# Patient Record
Sex: Male | Born: 1960 | Race: White | Hispanic: No | Marital: Married | Smoking: Never smoker
Health system: Southern US, Community
[De-identification: ages and names within clinical notes are randomized; demographics above are authoritative.]

## PROBLEM LIST (undated history)

## (undated) DIAGNOSIS — K7460 Unspecified cirrhosis of liver: Secondary | ICD-10-CM

## (undated) HISTORY — PX: CHOLECYSTECTOMY: SHX55

---

## 2002-12-01 ENCOUNTER — Ambulatory Visit (HOSPITAL_COMMUNITY): Admission: RE | Admit: 2002-12-01 | Discharge: 2002-12-01 | Payer: Self-pay | Admitting: Internal Medicine

## 2002-12-01 ENCOUNTER — Encounter: Payer: Self-pay | Admitting: Internal Medicine

## 2006-01-26 ENCOUNTER — Ambulatory Visit (HOSPITAL_COMMUNITY): Admission: RE | Admit: 2006-01-26 | Discharge: 2006-01-26 | Payer: Self-pay | Admitting: Family Medicine

## 2006-09-07 ENCOUNTER — Ambulatory Visit: Payer: Self-pay | Admitting: Internal Medicine

## 2006-09-07 ENCOUNTER — Inpatient Hospital Stay (HOSPITAL_COMMUNITY): Admission: AD | Admit: 2006-09-07 | Discharge: 2006-09-10 | Payer: Self-pay | Admitting: Family Medicine

## 2006-09-08 ENCOUNTER — Encounter (INDEPENDENT_AMBULATORY_CARE_PROVIDER_SITE_OTHER): Payer: Self-pay | Admitting: *Deleted

## 2006-09-15 ENCOUNTER — Ambulatory Visit: Payer: Self-pay | Admitting: Internal Medicine

## 2007-10-25 ENCOUNTER — Ambulatory Visit (HOSPITAL_COMMUNITY): Admission: RE | Admit: 2007-10-25 | Discharge: 2007-10-25 | Payer: Self-pay | Admitting: Family Medicine

## 2008-08-06 ENCOUNTER — Ambulatory Visit (HOSPITAL_COMMUNITY): Admission: RE | Admit: 2008-08-06 | Discharge: 2008-08-06 | Payer: Self-pay | Admitting: Family Medicine

## 2009-09-16 ENCOUNTER — Ambulatory Visit (HOSPITAL_COMMUNITY): Admission: RE | Admit: 2009-09-16 | Discharge: 2009-09-16 | Payer: Self-pay | Admitting: Family Medicine

## 2010-02-20 ENCOUNTER — Ambulatory Visit (HOSPITAL_COMMUNITY): Admission: RE | Admit: 2010-02-20 | Discharge: 2010-02-20 | Payer: Self-pay | Admitting: General Surgery

## 2010-12-26 NOTE — Op Note (Signed)
NAMEHAROON, SHATTO                  ACCOUNT NO.:  [id number]   MEDICAL RECORD NO.:  [id number]          PATIENT TYPE:  INP   LOCATION:  A222                          FACILITY:  [facility]   PHYSICIAN:  Lionel December, M.D.    DATE OF BIRTH:  19-Sep-1960   DATE OF PROCEDURE:  09/07/2006  DATE OF DISCHARGE:                               OPERATIVE REPORT   PROCEDURE:  Esophagogastroduodenoscopy.   INDICATION:  [name] is 50 year old Caucasian male with recurrent  epigastric pain now admitted with excruciating pain associated with  nausea and vomiting.  Prior ultrasound in April 2004 was negative but  ultrasound done this afternoon reveals cholelithiasis and borderline  thickening of the gallbladder wall.  He also has frequent heartburn.  I  have recommended cholecystectomy.  Given the chronicity of his symptoms  and his heartburn, the patient would like to have his upper GI tract  evaluated prior to surgery.  The procedure risks were reviewed with the  patient and informed consent was obtained.   MEDS FOR CONSCIOUS SEDATION:  Benzocaine spray for pharyngeal topical  anesthesia, Demerol 50 mg IV, Versed 5 mg IV.   FINDINGS:  The procedure was performed in the endoscopy suite.  The  patient's vital signs and O2 sat were monitored during the procedure and  remained stable.  The patient was placed in the left lateral position.  The Pentax videoscope was passed via oropharynx without any difficulty  into the esophagus.   Esophagus:  The mucosa of the esophagus was normal.  GE junction was  unremarkable.   Stomach:  It had a lot of bile in it.  Most of this was suctioned out.  There was no food debris.  The stomach distended very well with  insufflation.  The folds of the proximal stomach were normal.  Examination of the mucosa revealed granularity at the antrum and a  single prepyloric erosion but no ulcer crater was found.  The pyloric  channel was patent.  Angularis, fundus, and cardia were  examined by  retroflexing the scope and were normal.   Duodenum:  The bulbar mucosa was normal.  The scope was passed to the  second part of duodenum where mucosa and folds were normal.  The  endoscope was withdrawn.  The patient tolerated the procedure well.   FINAL DIAGNOSIS:  Erosive antral gastritis.   RECOMMENDATIONS:  1. H. pylori serology will be checked.  2. Surgical consultation has already been recommended and discussed      with Dr. Yetta Numbers and Dr. Lovell Sheehan.      Lionel December, M.D.  Electronically Signed     NR/MEDQ  D:  09/07/2006  T:  09/07/2006  Job:  [id number]   cc:   Kirk Ruths, M.D.  Fax: [phone]

## 2010-12-26 NOTE — Discharge Summary (Signed)
NAMEELMON, [name]   MEDICAL RECORD NO.:  [id number]          PATIENT TYPE:  INP   LOCATION:  A307                          FACILITY:  [facility]   PHYSICIAN:  Dalia Heading, M.D.  DATE OF BIRTH:  [birth year]   DATE OF ADMISSION:  09/07/2006  DATE OF DISCHARGE:  02/01/2008LH                               DISCHARGE SUMMARY   HOSPITAL COURSE SUMMARY:  Patient is a 50 year old white male who  presented to the hospital with worsening abdominal pain, nausea,  vomiting.  Dr. Regino Schultze admitted the patient.  A GI consultation was  obtained and it was felt the patient was suffering from acute  cholecystitis secondary to cholelithiasis.  He did have a mild elevation  of his liver enzyme tests.  Hepatitis screen was negative.  An EGD was  performed on September 07, 2006, which revealed erosive gastritis, but no  evidence of peptic ulcer disease.  A Surgery consultation was obtained  and the patient subsequently underwent a laparoscopic cholecystectomy  with liver biopsy on September 08, 2006.  A gangrenous gallbladder was  found.  The liver appeared to be within normal limits.  Final pathology  is still pending.   The patient tolerated the surgery well.  His diet was advanced without  difficulty.  His white blood cell count returned to normal.  His liver  enzyme tests also normalized.   The patient is being discharged home on September 10, 2006, in good and  improving condition.   DISCHARGE INSTRUCTIONS:  The patient is to follow up Dr. Franky Macho on  February 2 , 2008.  He is to drain and record his bulb suction twice a  day.   DISCHARGE MEDICATIONS:  1. Prilosec as previously prescribed.  2. Levaquin 500 mg p.o. daily x5 days.  3. Vicodin one to two tablets p.o. q.4h. p.r.n. pain.   PRINCIPAL DIAGNOSIS:  1. Cholecystitis, cholelithiasis.  2. Hepatitis, resolving.  3. Gastritis.   PRINCIPAL PROCEDURES:  1. EGD with biopsy by Dr. Karilyn Cota on September 07, 2006.  2. Laparoscopic cholecystectomy, liver biopsy on September 08, 2006.      Dalia Heading, M.D.  Electronically Signed     MAJ/MEDQ  D:  09/10/2006  T:  09/10/2006  Job:  [id number]   cc:   Dalia Heading, M.D.  Fax: [phone]   Lionel December, M.D.  [address]  [place]  [place] [address]   Kirk Ruths, M.D.  Fax: [phone]

## 2010-12-26 NOTE — Op Note (Signed)
[name], BROKER                  ACCOUNT NO.:  [id number]   MEDICAL RECORD NO.:  [id number]          PATIENT TYPE:  INP   LOCATION:  A307                          FACILITY:  [facility]   PHYSICIAN:  Dalia Heading, M.D.  DATE OF BIRTH:  [birth year]   DATE OF PROCEDURE:  09/08/2006  DATE OF DISCHARGE:                               OPERATIVE REPORT   PREOPERATIVE DIAGNOSIS:  Cholecystitis and cholelithiasis.   POSTOPERATIVE DIAGNOSIS:  Cholecystitis and cholelithiasis, gangrene of  gallbladder.   PROCEDURE:  Laparoscopic cholecystectomy, liver biopsy.   SURGEON:  Dalia Heading, M.D.   ANESTHESIA:  General endotracheal.   INDICATIONS:  The patient is a 50 year old white male who presents with  right upper quadrant abdominal pain, cholecystitis, and cholelithiasis.  He also has elevated liver enzyme tests.  The patient now comes to the  operating room for laparoscopic cholecystectomy with possible  cholangiograms and a liver biopsy.  The risks and benefits of the  procedures including bleeding, infection, hepatobiliary injury, the  possibly of an open procedure, were fully explained to the patient who  gave informed consent.   PROCEDURE NOTE:  The patient was placed in the supine position.  After  induction of general endotracheal anesthesia, the abdomen was prepped  and draped using the usual sterile technique with Betadine.  Surgical  site confirmation was performed.  A supraumbilical incision was made  down to the fascia.  A Veress needle was introduced into the abdominal  cavity and confirmation of placement was done using the saline drop  test.  The abdomen was then insufflated to 16 mmHg pressure.  An 11 mm  trocar was introduced into the abdominal cavity under direct  visualization without difficulty.  The patient was placed in reversed  Trendelenburg position.  An additional 11-mm trocar was placed in the  epigastric region and 5-mm trocars were placed in the right upper  quadrant and right flank regions.  The liver was inspected and appeared  to be within normal limits.  Two needle core biopsies were taken and  sent to pathology for further examination from the right lobe of the  liver.  Any bleeding was controlled using Bovie electrocautery.  The  gallbladder was noted be gangrenous.  Needle aspiration was performed to  decompress the gallbladder.  The dissection was begun around the  infundibulum of the gallbladder.  The cystic duct was first identified.  Its juncture to the infundibulum fully was identified.  A cholangiogram  was attempted but the catheter could not be extended through the cystic  duct due to the gangrenous changes.  Endoclips were  placed proximally  and distally on the cystic duct and the cystic duct was divided.  The  cystic artery was, likewise, ligated and divided.  The gallbladder was  freed away from the gallbladder fossa using Bovie electrocautery.  The  gallbladder was delivered through the epigastric trocar site using  EndoCatch bag.  A #10 flat Jackson-Pratt drain was placed into the  subhepatic space.  Any bleeding was controlled using Bovie  electrocautery.  Surgicel was placed in  the gallbladder fossa.  All  fluid and air was then evacuated from the abdominal cavity prior to  removal of the trocars.   All wounds were irrigated with normal saline.  All wounds were checked  with 0.5% Sensorcaine.  The supraumbilical fascia as well as epigastric  fascia were reapproximated using 0 Vicryl interrupted sutures.  All skin  incisions were closed using staples.  Betadine ointment and dry sterile  dressings were applied.  All tape and needle counts were correct at the  end of the procedure.  The patient was extubated in the operating room  and went back to the recovery room awake in stable condition.   COMPLICATIONS:  None.   SPECIMEN:  Gallbladder, liver biopsy.   BLOOD LOSS:  100 mL.   DRAINS:  Jackson-Pratt drains in the  subhepatic space.      Dalia Heading, M.D.  Electronically Signed     MAJ/MEDQ  D:  09/08/2006  T:  09/08/2006  Job:  [id number]   cc:   Kirk Ruths, M.D.  Fax: [phone]   Lionel December, M.D.  [address]  [place]  [place] [address]

## 2010-12-26 NOTE — Consult Note (Signed)
NAMELUCIFER, [name]                  ACCOUNT NO.:  [id number]   MEDICAL RECORD NO.:  [id number]          PATIENT TYPE:  INP   LOCATION:  A222                          FACILITY:  [facility]   PHYSICIAN:  Lionel December, M.D.    DATE OF BIRTH:  1960-10-09   DATE OF CONSULTATION:  09/07/2006  DATE OF DISCHARGE:                                 CONSULTATION   REASON FOR CONSULTATION:  Epigastric pain.   HISTORY OF PRESENT ILLNESS:  [name] is a 50 year old Caucasian male who  was admitted to Dr. Edison Simon service earlier today with a 4-day history  of epigastric pain. The patient states he has had epigastric pain off  and on for the past 4 years. He had an ultrasound in April 2004 which  was normal other than a fatty liver. He has been using OTC Zantac which  helped. He was in usual state of health until the past week, Friday  evening, when, within 2-3 hours of eating a meal, he developed  associated pain in his epigastric area. The pain continued, although it  decreased in intensity. He wanted to visit his son for his birthday  yesterday and decided not to seek medical advice. While he was driving  back, he had intense pain. He also had a few episodes of vomiting. He  was seen by Dr. Regino Schultze yesterday. He was begun on Zegerid, however, his  pain became intense and he went back to see him and was hospitalized.  The pain now is in the right upper quadrant. He has history of frequent  heartburn, at least 3 times a week. He has been on a PPI in the past but  not recently. He denies dysphagia, melena, or frank bleeding. He has had  a few episodes of hematochezia felt to be secondary to hemorrhoids.  Between these episodes he has maintained good appetite and he denies  weight loss. He drinks alcohol every day. He drinks 3-4 glasses of wine  or 3-4 cans of beer. He gives me the impression that his liver tests  were slightly elevated last year. He does not take any NSAIDs.   He is presently on:  1. HCTZ  12.5 mg p.o. every day.  2. Avapro 300 mg p.o. every day.  3. Protonix 40 mg IV q.24 h.  4. Dilaudid 2 mg IV q.3 or p.r.n. pain.  5. Zofran 4 mg IV q.3 h. p.r.n. nausea.   PAST MEDICAL HISTORY:  He has been hypotensive for about 5 years. He had  a vasectomy a few years ago. History of elevated transaminase for about  a year. He has received hep B vaccine through his employer. He remembers  getting all the 3 shots at usual intervals.   ALLERGIES:  To SULFA and PENICILLIN causing skin rash.   FAMILY HISTORY:  Father died of MI at age 56. Mother is in good health.  He does not have any siblings.   SOCIAL HISTORY:  He is separated. He has 2 grownup children. 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 He  smoked cigarettes for  15-20 years, but quit 10 years ago, and he drinks alcohol every day, 3-4  glasses of wine or 3-4 cans of beer daily.   PHYSICAL EXAMINATION:  A pleasant, well-developed, well-nourished,  Caucasian male who is in no acute distress. He weighs 190 pounds. He is  5 feet 10 inches tall. Pulse 84 per minute, blood pressure 135/93, temp  is 99.4, respiratory rate is 18.  CONJUNCTIVAE:  Pink.  SCLERAE:  Nonicteric.  OROPHARYNGEAL MUCOSA:  Normal. No neck masses or thyromegaly noted.  CARDIAC EXAM:  With regular rhythm, normal S1 and S2. No murmur or  gallop noted.  LUNGS:  Are clear to auscultation.  ABDOMEN:  Is symmetrical. Bowel sounds are normal on palpation. Is soft.  He has tenderness below the right costal margin which is worse with deep  inspiration. He has mild epigastric tenderness.  RECTAL EXAM:  Deferred. No clubbing or edema noted.   LABS FROM ADMISSION:  The WBC is 9.3, H&H is 14.9 and 42.5, MCV is  mildly elevated at 96.8, platelet count is 238,000, segs 83, bands 0.  Sodium 134, potassium 3.2, chloride 95, CO2 28, glucose is 144, BUN 11,  creatinine 0.95, total bilirubin is 1.6, direct is 0.3, ALP 59, AST 46,  ALT is 50. Albumin is  3.7.   An ultrasound reviewed which was just completed. It shows borderline  thickening to wall of the gallbladder, some sludge and tiny stones. Bile  duct is within normal caliber, less than 5 mm, and liver is echogenic.   ASSESSMENT:  1. [name] is a 50 year old Caucasian male who presents with severe      epigastric pain associated with nausea and vomiting who has mildly      elevated AST. His ALT is 50, which by new parameters, is normal but      higher than AST. He has cholelithiasis and borderline thickening to      wall of gallbladder. I suspect his acute symptomatology is due to      cholecystitis or cholelithiasis. Mildly elevated AST would appear      to be either due to fatty liver, mild alcoholic hepatitis, or      common bile duct stones. He has a history of frequent heartburn. He      could also have peptic ulcer disease. Recommendations: Peptic ulcer      disease needs to be ruled out prior to surgical intervention.  2. Hypokalemia secondary to diuretic therapy. Recommendations:  [name] will be added to his intravenous fluids at 20 milliequivalents      per liter.  3. He will have hepatitis B surface antigen, hepatitis B surface      antibody, and hep C antibody and liver function tests in morning.  4. Esophagogastroduodenoscopy this afternoon.   As discussed with Dr. Regino Schultze, surgical consultation has been requested  with Dr. Lovell Sheehan. Will also start the patient on Rocephin, 1 gram  intravenous every 24 hours.   I have talked with Dr. Lovell Sheehan and I feel that he should get a liver  biopsy and intraoperative cholangiogram at the time of surgery.   We appreciate the opportunity to participate in the care of this  gentleman.      Lionel December, M.D.  Electronically Signed     NR/MEDQ  D:  09/07/2006  T:  09/07/2006  Job:  [id number]

## 2010-12-26 NOTE — H&P (Signed)
[name], [name]                  ACCOUNT NO.:  [id number]   MEDICAL RECORD NO.:  [id number]          PATIENT TYPE:  INP   LOCATION:  A307                          FACILITY:  [facility]   PHYSICIAN:  Kirk Ruths, M.D.DATE OF BIRTH:  30-May-1961   DATE OF ADMISSION:  09/07/2006  DATE OF DISCHARGE:  LH                              HISTORY & PHYSICAL   CHIEF COMPLAINT:  Abdominal pain.   PRESENTING ILLNESS:  This is a 50 year old male who was seen the day  before admission with reported history of abdominal pain and bloating  and some nausea and vomiting.  The patient initially felt to have  gastroenteritis was treated with Zegerid and Phenergan, anti-emetics and  clear liquids.  The patient returned the day of admission.  The pain was  worse, localized in the mid-abdominal area.  The patient denied nausea,  vomiting, or diarrhea, although he did not have a bowel movement in 2  days.  Tenderness of his abdomen and worsening condition, he is admitted  for possible gallbladder disease, possible small bowel obstruction.   PAST MEDICAL HISTORY:  He is allergic to no medicines.  He has  hypertension, for which he takes Benicar 40/12.5.  He takes Prilosec for  chronic GERD.  The patient does admit to moderate alcohol use.  Denies  chest pain, shortness of breath.   REVIEW OF SYSTEMS:  Does have nausea and pain associated with eating.   PHYSICAL EXAMINATION:  GENERAL:  A well developed, well nourished male  who appears miserable.  VITAL SIGNS:  Temperature 99, pulse 70 and regular, blood pressure  140/100, respirations 20.  HEENT:  TMs normal.  Pupils equal, round and reactive to light and  accommodation.  Oropharynx benign.  NECK:  Supple.  No JVD, bruit, thyromegaly.  LUNGS:  Clear in all areas.  HEART:  Regular sinus rhythm without murmur, gallop or rub.  ABDOMEN:  Tender epigastric and right upper quadrant.  Positive bowel  sounds.  EXTREMITIES:  No clubbing, cyanosis or edema.  NEUROLOGIC:  Grossly intact.   ASSESSMENT:  Abdominal pain.      Kirk Ruths, M.D.  Electronically Signed     WMM/MEDQ  D:  09/09/2006  T:  09/09/2006  Job:  [id number]

## 2011-04-14 ENCOUNTER — Other Ambulatory Visit (HOSPITAL_COMMUNITY): Payer: Self-pay | Admitting: Family Medicine

## 2011-04-14 ENCOUNTER — Ambulatory Visit (HOSPITAL_COMMUNITY)
Admission: RE | Admit: 2011-04-14 | Discharge: 2011-04-14 | Disposition: A | Discharge status: Home / Self Care | Payer: BC Managed Care – PPO | Source: Ambulatory Visit | Attending: Family Medicine | Admitting: Family Medicine

## 2011-04-14 DIAGNOSIS — R05 Cough: Secondary | ICD-10-CM

## 2011-04-14 DIAGNOSIS — E119 Type 2 diabetes mellitus without complications: Secondary | ICD-10-CM

## 2011-04-14 DIAGNOSIS — R059 Cough, unspecified: Secondary | ICD-10-CM | POA: Insufficient documentation

## 2011-04-14 DIAGNOSIS — Z0000 Encounter for general adult medical examination without abnormal findings: Secondary | ICD-10-CM

## 2014-10-17 ENCOUNTER — Other Ambulatory Visit (HOSPITAL_COMMUNITY): Payer: Self-pay | Admitting: Oncology

## 2014-10-17 DIAGNOSIS — K760 Fatty (change of) liver, not elsewhere classified: Secondary | ICD-10-CM

## 2014-10-29 ENCOUNTER — Ambulatory Visit (HOSPITAL_COMMUNITY)
Admission: RE | Admit: 2014-10-29 | Discharge: 2014-10-29 | Disposition: A | Discharge status: Home / Self Care | Payer: BLUE CROSS/BLUE SHIELD | Source: Ambulatory Visit | Attending: Oncology | Admitting: Oncology

## 2014-10-29 DIAGNOSIS — R7989 Other specified abnormal findings of blood chemistry: Secondary | ICD-10-CM | POA: Diagnosis not present

## 2014-10-29 DIAGNOSIS — K760 Fatty (change of) liver, not elsewhere classified: Secondary | ICD-10-CM | POA: Insufficient documentation

## 2014-10-29 DIAGNOSIS — D696 Thrombocytopenia, unspecified: Secondary | ICD-10-CM | POA: Diagnosis not present

## 2014-10-29 MED ORDER — IOHEXOL 300 MG/ML  SOLN
100.0000 mL | Freq: Once | INTRAMUSCULAR | Status: AC | PRN
  Administered 2014-10-29: 100 mL via INTRAVENOUS

## 2015-11-14 DIAGNOSIS — E119 Type 2 diabetes mellitus without complications: Secondary | ICD-10-CM | POA: Diagnosis not present

## 2015-11-14 DIAGNOSIS — R740 Nonspecific elevation of levels of transaminase and lactic acid dehydrogenase [LDH]: Secondary | ICD-10-CM | POA: Diagnosis not present

## 2015-11-14 DIAGNOSIS — E782 Mixed hyperlipidemia: Secondary | ICD-10-CM | POA: Diagnosis not present

## 2015-11-14 DIAGNOSIS — I10 Essential (primary) hypertension: Secondary | ICD-10-CM | POA: Diagnosis not present

## 2016-02-25 DIAGNOSIS — Z79899 Other long term (current) drug therapy: Secondary | ICD-10-CM | POA: Diagnosis not present

## 2016-02-25 DIAGNOSIS — E782 Mixed hyperlipidemia: Secondary | ICD-10-CM | POA: Diagnosis not present

## 2016-02-25 DIAGNOSIS — R740 Nonspecific elevation of levels of transaminase and lactic acid dehydrogenase [LDH]: Secondary | ICD-10-CM | POA: Diagnosis not present

## 2016-02-25 DIAGNOSIS — I10 Essential (primary) hypertension: Secondary | ICD-10-CM | POA: Diagnosis not present

## 2016-02-25 DIAGNOSIS — D696 Thrombocytopenia, unspecified: Secondary | ICD-10-CM | POA: Diagnosis not present

## 2016-02-25 DIAGNOSIS — E119 Type 2 diabetes mellitus without complications: Secondary | ICD-10-CM | POA: Diagnosis not present

## 2016-03-10 DIAGNOSIS — R740 Nonspecific elevation of levels of transaminase and lactic acid dehydrogenase [LDH]: Secondary | ICD-10-CM | POA: Diagnosis not present

## 2016-03-10 DIAGNOSIS — E782 Mixed hyperlipidemia: Secondary | ICD-10-CM | POA: Diagnosis not present

## 2016-03-10 DIAGNOSIS — I10 Essential (primary) hypertension: Secondary | ICD-10-CM | POA: Diagnosis not present

## 2016-03-10 DIAGNOSIS — E119 Type 2 diabetes mellitus without complications: Secondary | ICD-10-CM | POA: Diagnosis not present

## 2016-04-09 DIAGNOSIS — D72819 Decreased white blood cell count, unspecified: Secondary | ICD-10-CM | POA: Diagnosis not present

## 2016-04-23 DIAGNOSIS — D72819 Decreased white blood cell count, unspecified: Secondary | ICD-10-CM | POA: Diagnosis not present

## 2016-05-12 DIAGNOSIS — Z6831 Body mass index (BMI) 31.0-31.9, adult: Secondary | ICD-10-CM | POA: Diagnosis not present

## 2016-05-12 DIAGNOSIS — Z1389 Encounter for screening for other disorder: Secondary | ICD-10-CM | POA: Diagnosis not present

## 2016-05-12 DIAGNOSIS — Z23 Encounter for immunization: Secondary | ICD-10-CM | POA: Diagnosis not present

## 2016-05-12 DIAGNOSIS — E6609 Other obesity due to excess calories: Secondary | ICD-10-CM | POA: Diagnosis not present

## 2016-05-12 DIAGNOSIS — L02425 Furuncle of right lower limb: Secondary | ICD-10-CM | POA: Diagnosis not present

## 2016-05-12 DIAGNOSIS — B9562 Methicillin resistant Staphylococcus aureus infection as the cause of diseases classified elsewhere: Secondary | ICD-10-CM | POA: Diagnosis not present

## 2016-07-09 DIAGNOSIS — E782 Mixed hyperlipidemia: Secondary | ICD-10-CM | POA: Diagnosis not present

## 2016-07-09 DIAGNOSIS — R740 Nonspecific elevation of levels of transaminase and lactic acid dehydrogenase [LDH]: Secondary | ICD-10-CM | POA: Diagnosis not present

## 2016-07-09 DIAGNOSIS — E119 Type 2 diabetes mellitus without complications: Secondary | ICD-10-CM | POA: Diagnosis not present

## 2016-07-09 DIAGNOSIS — I10 Essential (primary) hypertension: Secondary | ICD-10-CM | POA: Diagnosis not present

## 2016-07-28 DIAGNOSIS — E559 Vitamin D deficiency, unspecified: Secondary | ICD-10-CM | POA: Diagnosis not present

## 2016-07-28 DIAGNOSIS — Z1389 Encounter for screening for other disorder: Secondary | ICD-10-CM | POA: Diagnosis not present

## 2016-07-28 DIAGNOSIS — D696 Thrombocytopenia, unspecified: Secondary | ICD-10-CM | POA: Diagnosis not present

## 2016-07-28 DIAGNOSIS — E782 Mixed hyperlipidemia: Secondary | ICD-10-CM | POA: Diagnosis not present

## 2016-07-28 DIAGNOSIS — E119 Type 2 diabetes mellitus without complications: Secondary | ICD-10-CM | POA: Diagnosis not present

## 2016-07-28 DIAGNOSIS — Z79899 Other long term (current) drug therapy: Secondary | ICD-10-CM | POA: Diagnosis not present

## 2016-08-14 DIAGNOSIS — R740 Nonspecific elevation of levels of transaminase and lactic acid dehydrogenase [LDH]: Secondary | ICD-10-CM | POA: Diagnosis not present

## 2016-08-14 DIAGNOSIS — Z0001 Encounter for general adult medical examination with abnormal findings: Secondary | ICD-10-CM | POA: Diagnosis not present

## 2016-08-14 DIAGNOSIS — Z1389 Encounter for screening for other disorder: Secondary | ICD-10-CM | POA: Diagnosis not present

## 2016-08-14 DIAGNOSIS — E1129 Type 2 diabetes mellitus with other diabetic kidney complication: Secondary | ICD-10-CM | POA: Diagnosis not present

## 2016-08-14 DIAGNOSIS — Z6831 Body mass index (BMI) 31.0-31.9, adult: Secondary | ICD-10-CM | POA: Diagnosis not present

## 2016-08-25 DIAGNOSIS — E782 Mixed hyperlipidemia: Secondary | ICD-10-CM | POA: Diagnosis not present

## 2016-08-25 DIAGNOSIS — E119 Type 2 diabetes mellitus without complications: Secondary | ICD-10-CM | POA: Diagnosis not present

## 2016-08-25 DIAGNOSIS — I10 Essential (primary) hypertension: Secondary | ICD-10-CM | POA: Diagnosis not present

## 2016-08-25 DIAGNOSIS — R740 Nonspecific elevation of levels of transaminase and lactic acid dehydrogenase [LDH]: Secondary | ICD-10-CM | POA: Diagnosis not present

## 2017-01-05 DIAGNOSIS — R740 Nonspecific elevation of levels of transaminase and lactic acid dehydrogenase [LDH]: Secondary | ICD-10-CM | POA: Diagnosis not present

## 2017-01-05 DIAGNOSIS — D72819 Decreased white blood cell count, unspecified: Secondary | ICD-10-CM | POA: Diagnosis not present

## 2017-01-05 DIAGNOSIS — Z139 Encounter for screening, unspecified: Secondary | ICD-10-CM | POA: Diagnosis not present

## 2017-01-05 DIAGNOSIS — I10 Essential (primary) hypertension: Secondary | ICD-10-CM | POA: Diagnosis not present

## 2017-01-05 DIAGNOSIS — Z719 Counseling, unspecified: Secondary | ICD-10-CM | POA: Diagnosis not present

## 2017-01-05 DIAGNOSIS — Z008 Encounter for other general examination: Secondary | ICD-10-CM | POA: Diagnosis not present

## 2017-01-05 DIAGNOSIS — E119 Type 2 diabetes mellitus without complications: Secondary | ICD-10-CM | POA: Diagnosis not present

## 2017-01-12 DIAGNOSIS — R740 Nonspecific elevation of levels of transaminase and lactic acid dehydrogenase [LDH]: Secondary | ICD-10-CM | POA: Diagnosis not present

## 2017-01-12 DIAGNOSIS — D696 Thrombocytopenia, unspecified: Secondary | ICD-10-CM | POA: Diagnosis not present

## 2017-01-12 DIAGNOSIS — E119 Type 2 diabetes mellitus without complications: Secondary | ICD-10-CM | POA: Diagnosis not present

## 2017-04-20 DIAGNOSIS — Z008 Encounter for other general examination: Secondary | ICD-10-CM | POA: Diagnosis not present

## 2017-04-20 DIAGNOSIS — I10 Essential (primary) hypertension: Secondary | ICD-10-CM | POA: Diagnosis not present

## 2017-04-20 DIAGNOSIS — E782 Mixed hyperlipidemia: Secondary | ICD-10-CM | POA: Diagnosis not present

## 2017-04-20 DIAGNOSIS — E119 Type 2 diabetes mellitus without complications: Secondary | ICD-10-CM | POA: Diagnosis not present

## 2017-04-20 DIAGNOSIS — D696 Thrombocytopenia, unspecified: Secondary | ICD-10-CM | POA: Diagnosis not present

## 2017-04-20 DIAGNOSIS — Z719 Counseling, unspecified: Secondary | ICD-10-CM | POA: Diagnosis not present

## 2017-04-20 DIAGNOSIS — R740 Nonspecific elevation of levels of transaminase and lactic acid dehydrogenase [LDH]: Secondary | ICD-10-CM | POA: Diagnosis not present

## 2017-05-04 DIAGNOSIS — E119 Type 2 diabetes mellitus without complications: Secondary | ICD-10-CM | POA: Diagnosis not present

## 2017-05-04 DIAGNOSIS — R740 Nonspecific elevation of levels of transaminase and lactic acid dehydrogenase [LDH]: Secondary | ICD-10-CM | POA: Diagnosis not present

## 2017-05-04 DIAGNOSIS — E876 Hypokalemia: Secondary | ICD-10-CM | POA: Diagnosis not present

## 2017-05-04 DIAGNOSIS — I10 Essential (primary) hypertension: Secondary | ICD-10-CM | POA: Diagnosis not present

## 2017-05-11 DIAGNOSIS — R740 Nonspecific elevation of levels of transaminase and lactic acid dehydrogenase [LDH]: Secondary | ICD-10-CM | POA: Diagnosis not present

## 2017-05-11 DIAGNOSIS — E119 Type 2 diabetes mellitus without complications: Secondary | ICD-10-CM | POA: Diagnosis not present

## 2017-05-11 DIAGNOSIS — Z008 Encounter for other general examination: Secondary | ICD-10-CM | POA: Diagnosis not present

## 2017-05-11 DIAGNOSIS — I10 Essential (primary) hypertension: Secondary | ICD-10-CM | POA: Diagnosis not present

## 2017-05-11 DIAGNOSIS — Z719 Counseling, unspecified: Secondary | ICD-10-CM | POA: Diagnosis not present

## 2017-08-31 DIAGNOSIS — I10 Essential (primary) hypertension: Secondary | ICD-10-CM | POA: Diagnosis not present

## 2017-08-31 DIAGNOSIS — Z008 Encounter for other general examination: Secondary | ICD-10-CM | POA: Diagnosis not present

## 2017-08-31 DIAGNOSIS — R740 Nonspecific elevation of levels of transaminase and lactic acid dehydrogenase [LDH]: Secondary | ICD-10-CM | POA: Diagnosis not present

## 2017-08-31 DIAGNOSIS — D696 Thrombocytopenia, unspecified: Secondary | ICD-10-CM | POA: Diagnosis not present

## 2017-08-31 DIAGNOSIS — E119 Type 2 diabetes mellitus without complications: Secondary | ICD-10-CM | POA: Diagnosis not present

## 2017-08-31 DIAGNOSIS — Z79899 Other long term (current) drug therapy: Secondary | ICD-10-CM | POA: Diagnosis not present

## 2017-08-31 DIAGNOSIS — Z719 Counseling, unspecified: Secondary | ICD-10-CM | POA: Diagnosis not present

## 2017-08-31 DIAGNOSIS — E782 Mixed hyperlipidemia: Secondary | ICD-10-CM | POA: Diagnosis not present

## 2017-09-09 DIAGNOSIS — E119 Type 2 diabetes mellitus without complications: Secondary | ICD-10-CM | POA: Diagnosis not present

## 2017-09-09 DIAGNOSIS — R740 Nonspecific elevation of levels of transaminase and lactic acid dehydrogenase [LDH]: Secondary | ICD-10-CM | POA: Diagnosis not present

## 2017-09-09 DIAGNOSIS — E782 Mixed hyperlipidemia: Secondary | ICD-10-CM | POA: Diagnosis not present

## 2017-09-09 DIAGNOSIS — I10 Essential (primary) hypertension: Secondary | ICD-10-CM | POA: Diagnosis not present

## 2017-09-10 DIAGNOSIS — Z1389 Encounter for screening for other disorder: Secondary | ICD-10-CM | POA: Diagnosis not present

## 2017-09-10 DIAGNOSIS — E782 Mixed hyperlipidemia: Secondary | ICD-10-CM | POA: Diagnosis not present

## 2017-09-10 DIAGNOSIS — I10 Essential (primary) hypertension: Secondary | ICD-10-CM | POA: Diagnosis not present

## 2017-09-10 DIAGNOSIS — R011 Cardiac murmur, unspecified: Secondary | ICD-10-CM | POA: Diagnosis not present

## 2017-09-10 DIAGNOSIS — K769 Liver disease, unspecified: Secondary | ICD-10-CM | POA: Diagnosis not present

## 2017-09-10 DIAGNOSIS — Z6832 Body mass index (BMI) 32.0-32.9, adult: Secondary | ICD-10-CM | POA: Diagnosis not present

## 2017-09-10 DIAGNOSIS — E1165 Type 2 diabetes mellitus with hyperglycemia: Secondary | ICD-10-CM | POA: Diagnosis not present

## 2017-09-10 DIAGNOSIS — E1129 Type 2 diabetes mellitus with other diabetic kidney complication: Secondary | ICD-10-CM | POA: Diagnosis not present

## 2017-09-28 DIAGNOSIS — J0100 Acute maxillary sinusitis, unspecified: Secondary | ICD-10-CM | POA: Diagnosis not present

## 2017-10-18 DIAGNOSIS — D6949 Other primary thrombocytopenia: Secondary | ICD-10-CM | POA: Diagnosis not present

## 2017-10-18 DIAGNOSIS — Z6831 Body mass index (BMI) 31.0-31.9, adult: Secondary | ICD-10-CM | POA: Diagnosis not present

## 2017-10-18 DIAGNOSIS — Z0001 Encounter for general adult medical examination with abnormal findings: Secondary | ICD-10-CM | POA: Diagnosis not present

## 2017-10-18 DIAGNOSIS — Z1389 Encounter for screening for other disorder: Secondary | ICD-10-CM | POA: Diagnosis not present

## 2017-10-18 DIAGNOSIS — R000 Tachycardia, unspecified: Secondary | ICD-10-CM | POA: Diagnosis not present

## 2017-10-18 DIAGNOSIS — R319 Hematuria, unspecified: Secondary | ICD-10-CM | POA: Diagnosis not present

## 2017-10-18 DIAGNOSIS — R740 Nonspecific elevation of levels of transaminase and lactic acid dehydrogenase [LDH]: Secondary | ICD-10-CM | POA: Diagnosis not present

## 2017-10-18 DIAGNOSIS — R011 Cardiac murmur, unspecified: Secondary | ICD-10-CM | POA: Diagnosis not present

## 2017-10-18 DIAGNOSIS — E1129 Type 2 diabetes mellitus with other diabetic kidney complication: Secondary | ICD-10-CM | POA: Diagnosis not present

## 2017-10-18 DIAGNOSIS — D696 Thrombocytopenia, unspecified: Secondary | ICD-10-CM | POA: Diagnosis not present

## 2017-12-14 DIAGNOSIS — R740 Nonspecific elevation of levels of transaminase and lactic acid dehydrogenase [LDH]: Secondary | ICD-10-CM | POA: Diagnosis not present

## 2017-12-14 DIAGNOSIS — Z139 Encounter for screening, unspecified: Secondary | ICD-10-CM | POA: Diagnosis not present

## 2017-12-14 DIAGNOSIS — E1129 Type 2 diabetes mellitus with other diabetic kidney complication: Secondary | ICD-10-CM | POA: Diagnosis not present

## 2017-12-14 DIAGNOSIS — L309 Dermatitis, unspecified: Secondary | ICD-10-CM | POA: Diagnosis not present

## 2017-12-14 DIAGNOSIS — I10 Essential (primary) hypertension: Secondary | ICD-10-CM | POA: Diagnosis not present

## 2017-12-14 DIAGNOSIS — E119 Type 2 diabetes mellitus without complications: Secondary | ICD-10-CM | POA: Diagnosis not present

## 2017-12-14 DIAGNOSIS — Z719 Counseling, unspecified: Secondary | ICD-10-CM | POA: Diagnosis not present

## 2017-12-14 DIAGNOSIS — Z008 Encounter for other general examination: Secondary | ICD-10-CM | POA: Diagnosis not present

## 2017-12-14 DIAGNOSIS — E782 Mixed hyperlipidemia: Secondary | ICD-10-CM | POA: Diagnosis not present

## 2017-12-16 DIAGNOSIS — E119 Type 2 diabetes mellitus without complications: Secondary | ICD-10-CM | POA: Diagnosis not present

## 2017-12-16 DIAGNOSIS — E782 Mixed hyperlipidemia: Secondary | ICD-10-CM | POA: Diagnosis not present

## 2017-12-16 DIAGNOSIS — D696 Thrombocytopenia, unspecified: Secondary | ICD-10-CM | POA: Diagnosis not present

## 2017-12-16 DIAGNOSIS — R740 Nonspecific elevation of levels of transaminase and lactic acid dehydrogenase [LDH]: Secondary | ICD-10-CM | POA: Diagnosis not present

## 2017-12-17 DIAGNOSIS — D696 Thrombocytopenia, unspecified: Secondary | ICD-10-CM | POA: Diagnosis not present

## 2017-12-17 DIAGNOSIS — Z1389 Encounter for screening for other disorder: Secondary | ICD-10-CM | POA: Diagnosis not present

## 2017-12-17 DIAGNOSIS — E7849 Other hyperlipidemia: Secondary | ICD-10-CM | POA: Diagnosis not present

## 2017-12-17 DIAGNOSIS — K769 Liver disease, unspecified: Secondary | ICD-10-CM | POA: Diagnosis not present

## 2017-12-17 DIAGNOSIS — D6942 Congenital and hereditary thrombocytopenia purpura: Secondary | ICD-10-CM | POA: Diagnosis not present

## 2017-12-17 DIAGNOSIS — E6609 Other obesity due to excess calories: Secondary | ICD-10-CM | POA: Diagnosis not present

## 2017-12-17 DIAGNOSIS — Z6831 Body mass index (BMI) 31.0-31.9, adult: Secondary | ICD-10-CM | POA: Diagnosis not present

## 2017-12-17 DIAGNOSIS — E559 Vitamin D deficiency, unspecified: Secondary | ICD-10-CM | POA: Diagnosis not present

## 2017-12-17 DIAGNOSIS — E119 Type 2 diabetes mellitus without complications: Secondary | ICD-10-CM | POA: Diagnosis not present

## 2018-02-24 DIAGNOSIS — Z008 Encounter for other general examination: Secondary | ICD-10-CM | POA: Diagnosis not present

## 2018-02-24 DIAGNOSIS — R740 Nonspecific elevation of levels of transaminase and lactic acid dehydrogenase [LDH]: Secondary | ICD-10-CM | POA: Diagnosis not present

## 2018-02-24 DIAGNOSIS — E119 Type 2 diabetes mellitus without complications: Secondary | ICD-10-CM | POA: Diagnosis not present

## 2018-02-24 DIAGNOSIS — I10 Essential (primary) hypertension: Secondary | ICD-10-CM | POA: Diagnosis not present

## 2018-02-24 DIAGNOSIS — Z719 Counseling, unspecified: Secondary | ICD-10-CM | POA: Diagnosis not present

## 2018-06-23 DIAGNOSIS — I10 Essential (primary) hypertension: Secondary | ICD-10-CM | POA: Diagnosis not present

## 2018-06-23 DIAGNOSIS — Z719 Counseling, unspecified: Secondary | ICD-10-CM | POA: Diagnosis not present

## 2018-06-23 DIAGNOSIS — R740 Nonspecific elevation of levels of transaminase and lactic acid dehydrogenase [LDH]: Secondary | ICD-10-CM | POA: Diagnosis not present

## 2018-06-23 DIAGNOSIS — E119 Type 2 diabetes mellitus without complications: Secondary | ICD-10-CM | POA: Diagnosis not present

## 2018-08-30 DIAGNOSIS — I10 Essential (primary) hypertension: Secondary | ICD-10-CM | POA: Diagnosis not present

## 2018-08-30 DIAGNOSIS — Z719 Counseling, unspecified: Secondary | ICD-10-CM | POA: Diagnosis not present

## 2018-08-30 DIAGNOSIS — Z008 Encounter for other general examination: Secondary | ICD-10-CM | POA: Diagnosis not present

## 2018-08-30 DIAGNOSIS — E119 Type 2 diabetes mellitus without complications: Secondary | ICD-10-CM | POA: Diagnosis not present

## 2018-10-20 DIAGNOSIS — E119 Type 2 diabetes mellitus without complications: Secondary | ICD-10-CM | POA: Diagnosis not present

## 2018-10-20 DIAGNOSIS — E7849 Other hyperlipidemia: Secondary | ICD-10-CM | POA: Diagnosis not present

## 2018-10-20 DIAGNOSIS — I10 Essential (primary) hypertension: Secondary | ICD-10-CM | POA: Diagnosis not present

## 2018-10-20 DIAGNOSIS — Z0001 Encounter for general adult medical examination with abnormal findings: Secondary | ICD-10-CM | POA: Diagnosis not present

## 2018-10-20 DIAGNOSIS — Z6829 Body mass index (BMI) 29.0-29.9, adult: Secondary | ICD-10-CM | POA: Diagnosis not present

## 2018-10-20 DIAGNOSIS — E663 Overweight: Secondary | ICD-10-CM | POA: Diagnosis not present

## 2018-10-20 DIAGNOSIS — Z1389 Encounter for screening for other disorder: Secondary | ICD-10-CM | POA: Diagnosis not present

## 2018-10-21 DIAGNOSIS — Z0001 Encounter for general adult medical examination with abnormal findings: Secondary | ICD-10-CM | POA: Diagnosis not present

## 2018-10-21 DIAGNOSIS — E119 Type 2 diabetes mellitus without complications: Secondary | ICD-10-CM | POA: Diagnosis not present

## 2018-10-21 DIAGNOSIS — E559 Vitamin D deficiency, unspecified: Secondary | ICD-10-CM | POA: Diagnosis not present

## 2018-10-21 DIAGNOSIS — Z1389 Encounter for screening for other disorder: Secondary | ICD-10-CM | POA: Diagnosis not present

## 2018-10-21 DIAGNOSIS — E1129 Type 2 diabetes mellitus with other diabetic kidney complication: Secondary | ICD-10-CM | POA: Diagnosis not present

## 2019-03-09 DIAGNOSIS — Z719 Counseling, unspecified: Secondary | ICD-10-CM | POA: Diagnosis not present

## 2019-05-16 DIAGNOSIS — D696 Thrombocytopenia, unspecified: Secondary | ICD-10-CM | POA: Diagnosis not present

## 2019-05-16 DIAGNOSIS — Z139 Encounter for screening, unspecified: Secondary | ICD-10-CM | POA: Diagnosis not present

## 2019-05-16 DIAGNOSIS — E782 Mixed hyperlipidemia: Secondary | ICD-10-CM | POA: Diagnosis not present

## 2019-05-16 DIAGNOSIS — I10 Essential (primary) hypertension: Secondary | ICD-10-CM | POA: Diagnosis not present

## 2019-05-16 DIAGNOSIS — Z79899 Other long term (current) drug therapy: Secondary | ICD-10-CM | POA: Diagnosis not present

## 2019-05-16 DIAGNOSIS — E119 Type 2 diabetes mellitus without complications: Secondary | ICD-10-CM | POA: Diagnosis not present

## 2019-05-16 DIAGNOSIS — E559 Vitamin D deficiency, unspecified: Secondary | ICD-10-CM | POA: Diagnosis not present

## 2019-05-25 DIAGNOSIS — Z23 Encounter for immunization: Secondary | ICD-10-CM | POA: Diagnosis not present

## 2019-06-01 DIAGNOSIS — E663 Overweight: Secondary | ICD-10-CM | POA: Diagnosis not present

## 2019-06-01 DIAGNOSIS — I10 Essential (primary) hypertension: Secondary | ICD-10-CM | POA: Diagnosis not present

## 2019-06-01 DIAGNOSIS — E119 Type 2 diabetes mellitus without complications: Secondary | ICD-10-CM | POA: Diagnosis not present

## 2019-06-01 DIAGNOSIS — Z6826 Body mass index (BMI) 26.0-26.9, adult: Secondary | ICD-10-CM | POA: Diagnosis not present

## 2019-06-01 DIAGNOSIS — E7849 Other hyperlipidemia: Secondary | ICD-10-CM | POA: Diagnosis not present

## 2019-07-20 DIAGNOSIS — D649 Anemia, unspecified: Secondary | ICD-10-CM | POA: Diagnosis not present

## 2019-07-25 DIAGNOSIS — D649 Anemia, unspecified: Secondary | ICD-10-CM | POA: Diagnosis not present

## 2019-09-20 DIAGNOSIS — R945 Abnormal results of liver function studies: Secondary | ICD-10-CM | POA: Diagnosis not present

## 2019-09-20 DIAGNOSIS — E7849 Other hyperlipidemia: Secondary | ICD-10-CM | POA: Diagnosis not present

## 2019-09-20 DIAGNOSIS — Z6827 Body mass index (BMI) 27.0-27.9, adult: Secondary | ICD-10-CM | POA: Diagnosis not present

## 2019-09-20 DIAGNOSIS — D649 Anemia, unspecified: Secondary | ICD-10-CM | POA: Diagnosis not present

## 2019-09-21 ENCOUNTER — Other Ambulatory Visit: Payer: Self-pay | Admitting: Family Medicine

## 2019-09-21 ENCOUNTER — Other Ambulatory Visit (HOSPITAL_COMMUNITY): Payer: Self-pay | Admitting: Family Medicine

## 2019-09-21 DIAGNOSIS — R7989 Other specified abnormal findings of blood chemistry: Secondary | ICD-10-CM

## 2019-09-21 DIAGNOSIS — R945 Abnormal results of liver function studies: Secondary | ICD-10-CM

## 2019-09-21 DIAGNOSIS — D649 Anemia, unspecified: Secondary | ICD-10-CM

## 2019-09-29 ENCOUNTER — Other Ambulatory Visit: Payer: Self-pay

## 2019-09-29 ENCOUNTER — Ambulatory Visit (HOSPITAL_COMMUNITY)
Admission: RE | Admit: 2019-09-29 | Discharge: 2019-09-29 | Disposition: A | Discharge status: Home / Self Care | Payer: BC Managed Care – PPO | Source: Ambulatory Visit | Attending: Family Medicine | Admitting: Family Medicine

## 2019-09-29 DIAGNOSIS — D649 Anemia, unspecified: Secondary | ICD-10-CM | POA: Insufficient documentation

## 2019-09-29 DIAGNOSIS — R945 Abnormal results of liver function studies: Secondary | ICD-10-CM | POA: Insufficient documentation

## 2019-09-29 DIAGNOSIS — R188 Other ascites: Secondary | ICD-10-CM | POA: Diagnosis not present

## 2019-09-29 DIAGNOSIS — R7989 Other specified abnormal findings of blood chemistry: Secondary | ICD-10-CM | POA: Diagnosis not present

## 2019-10-09 ENCOUNTER — Other Ambulatory Visit: Payer: Self-pay

## 2019-10-09 ENCOUNTER — Encounter (HOSPITAL_COMMUNITY): Payer: Self-pay | Admitting: Hematology

## 2019-10-09 ENCOUNTER — Inpatient Hospital Stay (HOSPITAL_COMMUNITY): Payer: BC Managed Care – PPO | Attending: Hematology | Admitting: Hematology

## 2019-10-09 ENCOUNTER — Inpatient Hospital Stay (HOSPITAL_COMMUNITY): Payer: BC Managed Care – PPO

## 2019-10-09 VITALS — BP 94/61 | HR 110 | Temp 97.1°F | Resp 18 | Ht 69.0 in | Wt 183.0 lb

## 2019-10-09 DIAGNOSIS — R319 Hematuria, unspecified: Secondary | ICD-10-CM | POA: Insufficient documentation

## 2019-10-09 DIAGNOSIS — D539 Nutritional anemia, unspecified: Secondary | ICD-10-CM | POA: Diagnosis not present

## 2019-10-09 DIAGNOSIS — R634 Abnormal weight loss: Secondary | ICD-10-CM | POA: Diagnosis not present

## 2019-10-09 DIAGNOSIS — R17 Unspecified jaundice: Secondary | ICD-10-CM | POA: Diagnosis not present

## 2019-10-09 DIAGNOSIS — R188 Other ascites: Secondary | ICD-10-CM | POA: Diagnosis not present

## 2019-10-09 DIAGNOSIS — J90 Pleural effusion, not elsewhere classified: Secondary | ICD-10-CM | POA: Diagnosis not present

## 2019-10-09 DIAGNOSIS — R7989 Other specified abnormal findings of blood chemistry: Secondary | ICD-10-CM | POA: Diagnosis not present

## 2019-10-09 DIAGNOSIS — Z148 Genetic carrier of other disease: Secondary | ICD-10-CM | POA: Diagnosis not present

## 2019-10-09 DIAGNOSIS — M7989 Other specified soft tissue disorders: Secondary | ICD-10-CM | POA: Diagnosis not present

## 2019-10-09 DIAGNOSIS — K7460 Unspecified cirrhosis of liver: Secondary | ICD-10-CM | POA: Diagnosis not present

## 2019-10-09 DIAGNOSIS — D649 Anemia, unspecified: Secondary | ICD-10-CM

## 2019-10-09 DIAGNOSIS — Z79899 Other long term (current) drug therapy: Secondary | ICD-10-CM | POA: Diagnosis not present

## 2019-10-09 DIAGNOSIS — Z87891 Personal history of nicotine dependence: Secondary | ICD-10-CM | POA: Insufficient documentation

## 2019-10-09 DIAGNOSIS — R5383 Other fatigue: Secondary | ICD-10-CM | POA: Diagnosis not present

## 2019-10-09 LAB — COMPREHENSIVE METABOLIC PANEL
ALT: 31 U/L (ref 0–44)
AST: 86 U/L — ABNORMAL HIGH (ref 15–41)
Albumin: 1.6 g/dL — ABNORMAL LOW (ref 3.5–5.0)
Alkaline Phosphatase: 250 U/L — ABNORMAL HIGH (ref 38–126)
Anion gap: 8 (ref 5–15)
BUN: 21 mg/dL — ABNORMAL HIGH (ref 6–20)
CO2: 21 mmol/L — ABNORMAL LOW (ref 22–32)
Calcium: 8.5 mg/dL — ABNORMAL LOW (ref 8.9–10.3)
Chloride: 100 mmol/L (ref 98–111)
Creatinine, Ser: 1.4 mg/dL — ABNORMAL HIGH (ref 0.61–1.24)
GFR calc Af Amer: >60 mL/min (ref >60)
GFR calc non Af Amer: 54 mL/min — ABNORMAL LOW (ref >60)
Glucose, Bld: 163 mg/dL — ABNORMAL HIGH (ref 70–99)
Potassium: 5 mmol/L (ref 3.5–5.1)
Sodium: 129 mmol/L — ABNORMAL LOW (ref 135–145)
Total Bilirubin: 6.7 mg/dL — ABNORMAL HIGH (ref 0.3–1.2)
Total Protein: 7.1 g/dL (ref 6.5–8.1)

## 2019-10-09 LAB — URINALYSIS, ROUTINE W REFLEX MICROSCOPIC
Bilirubin Urine: NEGATIVE
Glucose, UA: NEGATIVE mg/dL
Ketones, ur: NEGATIVE mg/dL
Nitrite: NEGATIVE
Protein, ur: 100 mg/dL — AB
RBC / HPF: >50 RBC/hpf — ABNORMAL HIGH (ref 0–5)
Specific Gravity, Urine: 1.014 (ref 1.005–1.030)
WBC, UA: >50 WBC/hpf — ABNORMAL HIGH (ref 0–5)
pH: 6 (ref 5.0–8.0)

## 2019-10-09 LAB — CBC WITH DIFFERENTIAL/PLATELET
Abs Immature Granulocytes: 0.11 10*3/uL — ABNORMAL HIGH (ref 0.00–0.07)
Basophils Absolute: 0.1 10*3/uL (ref 0.0–0.1)
Basophils Relative: 1 %
Eosinophils Absolute: 0.2 10*3/uL (ref 0.0–0.5)
Eosinophils Relative: 2 %
HCT: 23.3 % — ABNORMAL LOW (ref 39.0–52.0)
Hemoglobin: 7.8 g/dL — ABNORMAL LOW (ref 13.0–17.0)
Immature Granulocytes: 1 %
Lymphocytes Relative: 12 %
Lymphs Abs: 1.1 10*3/uL (ref 0.7–4.0)
MCH: 37 pg — ABNORMAL HIGH (ref 26.0–34.0)
MCHC: 33.5 g/dL (ref 30.0–36.0)
MCV: 110.4 fL — ABNORMAL HIGH (ref 80.0–100.0)
Monocytes Absolute: 1.1 10*3/uL — ABNORMAL HIGH (ref 0.1–1.0)
Monocytes Relative: 11 %
Neutro Abs: 7.2 10*3/uL (ref 1.7–7.7)
Neutrophils Relative %: 73 %
Platelets: 161 10*3/uL (ref 150–400)
RBC: 2.11 MIL/uL — ABNORMAL LOW (ref 4.22–5.81)
RDW: 16.3 % — ABNORMAL HIGH (ref 11.5–15.5)
WBC: 9.8 10*3/uL (ref 4.0–10.5)
nRBC: 0 % (ref 0.0–0.2)

## 2019-10-09 LAB — LACTATE DEHYDROGENASE: LDH: 282 U/L — ABNORMAL HIGH (ref 98–192)

## 2019-10-09 LAB — APTT: aPTT: 43 seconds — ABNORMAL HIGH (ref 24–36)

## 2019-10-09 LAB — FIBRINOGEN: Fibrinogen: 188 mg/dL — ABNORMAL LOW (ref 210–475)

## 2019-10-09 LAB — PROTIME-INR
INR: 2 — ABNORMAL HIGH (ref 0.8–1.2)
Prothrombin Time: 23 seconds — ABNORMAL HIGH (ref 11.4–15.2)

## 2019-10-09 LAB — RETICULOCYTES
Immature Retic Fract: 11.1 % (ref 2.3–15.9)
RBC.: 2.1 MIL/uL — ABNORMAL LOW (ref 4.22–5.81)
Retic Count, Absolute: 93.7 10*3/uL (ref 19.0–186.0)
Retic Ct Pct: 4.5 % — ABNORMAL HIGH (ref 0.4–3.1)

## 2019-10-09 LAB — BILIRUBIN, DIRECT: Bilirubin, Direct: 3 mg/dL — ABNORMAL HIGH (ref 0.0–0.2)

## 2019-10-09 MED ORDER — CIPROFLOXACIN HCL 500 MG PO TABS: 500.0000 mg | ORAL_TABLET | Freq: Two times a day (BID) | ORAL | 0 refills | Status: DC

## 2019-10-09 NOTE — Progress Notes (Signed)
CONSULT NOTE  Patient Care Team: Sharilyn Sites, MD as PCP - General (Family Medicine)  CHIEF COMPLAINTS/PURPOSE OF CONSULTATION:  Macrocytic anemia.  HISTORY OF PRESENTING ILLNESS:  [name] 59 y.o. male is seen in consultation today at the request of Dr. Hilma Favors for abnormal CBC and other labs.  CBC on 09/20/2019 showed normal white count and platelet count.  Hemoglobin was 8.5 with MCV of 99.  Differential was normal.  Ferritin was elevated at 2410.  Folic acid and V37 was normal.  Percent saturation was 88.  Reticulocyte count was 2.9%.  Hepatitis panel was negative.  LFTs showed elevated total bilirubin of 8.5.  Creatinine was 1.98.  Patient reported feeling tired for the past few months.  He also reported 30 pound weight loss in the last 1 year.  This is due to decreased appetite.  Denies any fevers or night sweats.  Reported leg swellings in the last 3 to 6 months.  He also has hematuria for the past few weeks.  Occasional nosebleeds were reported.  Dr. Hilma Favors has stopped his blood pressure and diabetes medication.  Denies any history of blood transfusions.  No history of hepatitis.  He was evaluated by Dr. Abran Duke in 2015 in [place] for 1 visit.  Patient not sure the reason.  Denies having any bone marrow biopsy at that time.  Denies any bleeding per rectum or melena.  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  Denies any exposure to chemicals.  He quit smoking 25 years ago.  He reportedly drinks 1 to 2 glasses of wine every night for many years.  No personal or family history of hemochromatosis.  No family history of malignancies.  No family history of liver disease and cirrhosis.  Appetite is 25%.  Energy levels are low.  Denies any new onset pains.    MEDICAL HISTORY:  History reviewed. No pertinent past medical history.  SURGICAL HISTORY: Past Surgical History:  Procedure Laterality Date  . CHOLECYSTECTOMY      SOCIAL HISTORY: Social History   Socioeconomic  History  . Marital status: Married    Spouse name: Ruby  . Number of children: 2  . Years of education: Not on file  . Highest education level: Not on file  Occupational History  . Not on file  Tobacco Use  . Smoking status: Never Smoker  . Smokeless tobacco: Never Used  Substance and Sexual Activity  . Alcohol use: Yes    Alcohol/week: 2.0 standard drinks    Types: 2 Glasses of wine per week    Comment: Pt drinks every weekend  . Drug use: Never  . Sexual activity: Not on file  Other Topics Concern  . Not on file  Social History Narrative  . Not on file   Social Determinants of Health   Financial Resource Strain:   . Difficulty of Paying Living Expenses: Not on file  Food Insecurity:   . Worried About Charity fundraiser in the Last Year: Not on file  . Ran Out of Food in the Last Year: Not on file  Transportation Needs:   . Lack of Transportation (Medical): Not on file  . Lack of Transportation (Non-Medical): Not on file  Physical Activity:   . Days of Exercise per Week: Not on file  . Minutes of Exercise per Session: Not on file  Stress:   . Feeling of Stress : Not on file  Social Connections:   . Frequency of  Communication with Friends and Family: Not on file  . Frequency of Social Gatherings with Friends and Family: Not on file  . Attends Religious Services: Not on file  . Active Member of Clubs or Organizations: Not on file  . Attends Archivist Meetings: Not on file  . Marital Status: Not on file  Intimate Partner Violence:   . Fear of Current or Ex-Partner: Not on file  . Emotionally Abused: Not on file  . Physically Abused: Not on file  . Sexually Abused: Not on file    FAMILY HISTORY: Family History  Problem Relation Age of Onset  . Heart attack Father     ALLERGIES:  has no allergies on file.  MEDICATIONS:  Current Outpatient Medications  Medication Sig Dispense Refill  . Ascorbic Acid (VITAMIN C) 100 MG tablet Take 500 mg by mouth  daily.    . Cinnamon 500 MG capsule Take 500 mg by mouth daily.    . Garlic 814 MG TABS Take 100 mg by mouth daily.    . Misc Natural Products (TART CHERRY ADVANCED PO) Take by mouth daily.    . Multiple Vitamins-Minerals (MENS ONE DAILY PO) Take by mouth daily.     No current facility-administered medications for this visit.    REVIEW OF SYSTEMS:   Constitutional: Denies fevers, chills or abnormal night sweats Eyes: Denies blurriness of vision, double vision or watery eyes Ears, nose, mouth, throat, and face: Denies mucositis or sore throat Respiratory: Denies cough, dyspnea or wheezes Cardiovascular: Denies palpitation, chest discomfort.  Positive for leg swelling. Gastrointestinal: Decreased appetite.  Weight loss is positive.  Occasional nausea present. Skin: Denies abnormal skin rashes Lymphatics: Denies new lymphadenopathy or easy bruising Neurological:Denies numbness, tingling or new weaknesses Behavioral/Psych: Mood is stable, no new changes  All other systems were reviewed with the patient and are negative.  PHYSICAL EXAMINATION: ECOG PERFORMANCE STATUS: 2 - Symptomatic, <50% confined to bed  Vitals:   10/09/19 1353  BP: 94/61  Pulse: (!) 110  Resp: 18  Temp: (!) 97.1 F (36.2 C)  SpO2: 100%   Filed Weights   10/09/19 1353  Weight: 183 lb (83 kg)    GENERAL:alert, no distress and comfortable SKIN: skin color, texture, turgor are normal, no rashes or significant lesions EYES: normal, conjunctiva are pink and non-injected.  Positive for scleral icterus. OROPHARYNX:no exudate, no erythema and lips, buccal mucosa, and tongue normal  NECK: supple, thyroid normal size, non-tender, without nodularity LYMPH:  no palpable lymphadenopathy in the cervical, axillary or inguinal LUNGS: clear to auscultation and percussion with normal breathing effort HEART: regular rate & rhythm and no murmurs.  2+ edema bilaterally. ABDOMEN: Abdomen is soft nontender  nondistended. Musculoskeletal:no cyanosis of digits and no clubbing  PSYCH: alert & oriented x 3 with fluent speech NEURO: no focal motor/sensory deficits  LABORATORY DATA:  I have reviewed the data as listed Recent Results (from the past 2160 hour(s))  Urinalysis, Routine w reflex microscopic     Status: Abnormal   Collection Time: 10/09/19  3:10 PM  Result Value Ref Range   Color, Urine YELLOW YELLOW   APPearance TURBID (A) CLEAR   Specific Gravity, Urine 1.014 1.005 - 1.030   pH 6.0 5.0 - 8.0   Glucose, UA NEGATIVE NEGATIVE mg/dL   Hgb urine dipstick LARGE (A) NEGATIVE   Bilirubin Urine NEGATIVE NEGATIVE   Ketones, ur NEGATIVE NEGATIVE mg/dL   Protein, ur 100 (A) NEGATIVE mg/dL   Nitrite NEGATIVE NEGATIVE  Leukocytes,Ua MODERATE (A) NEGATIVE   RBC / HPF >50 (H) 0 - 5 RBC/hpf   WBC, UA >50 (H) 0 - 5 WBC/hpf   Bacteria, UA RARE (A) NONE SEEN   Squamous Epithelial / LPF 6-10 0 - 5   WBC Clumps PRESENT    Non Squamous Epithelial 6-10 (A) NONE SEEN    Comment: Performed at [facility] [address] [place], [place] [address]  CBC with Differential/Platelet     Status: Abnormal   Collection Time: 10/09/19  3:32 PM  Result Value Ref Range   WBC 9.8 4.0 - 10.5 K/uL   RBC 2.11 (L) 4.22 - 5.81 MIL/uL   Hemoglobin 7.8 (L) 13.0 - 17.0 g/dL   HCT 23.3 (L) 39.0 - 52.0 %   MCV 110.4 (H) 80.0 - 100.0 fL   MCH 37.0 (H) 26.0 - 34.0 pg   MCHC 33.5 30.0 - 36.0 g/dL   RDW 16.3 (H) 11.5 - 15.5 %   Platelets 161 150 - 400 K/uL   nRBC 0.0 0.0 - 0.2 %   Neutrophils Relative % 73 %   Neutro Abs 7.2 1.7 - 7.7 K/uL   Lymphocytes Relative 12 %   Lymphs Abs 1.1 0.7 - 4.0 K/uL   Monocytes Relative 11 %   Monocytes Absolute 1.1 (H) 0.1 - 1.0 K/uL   Eosinophils Relative 2 %   Eosinophils Absolute 0.2 0.0 - 0.5 K/uL   Basophils Relative 1 %   Basophils Absolute 0.1 0.0 - 0.1 K/uL   RBC Morphology MACROCYTOSIS PRESENT    Immature Granulocytes 1 %   Abs Immature Granulocytes 0.11 (H) 0.00 -  0.07 K/uL    Comment: Performed at [facility], [address] [place], [place] [address]  Comprehensive metabolic panel     Status: Abnormal   Collection Time: 10/09/19  3:32 PM  Result Value Ref Range   Sodium 129 (L) 135 - 145 mmol/L   Potassium 5.0 3.5 - 5.1 mmol/L   Chloride 100 98 - 111 mmol/L   CO2 21 (L) 22 - 32 mmol/L   Glucose, Bld 163 (H) 70 - 99 mg/dL    Comment: Glucose reference range applies only to samples taken after fasting for at least 8 hours.   BUN 21 (H) 6 - 20 mg/dL   Creatinine, Ser 1.40 (H) 0.61 - 1.24 mg/dL   Calcium 8.5 (L) 8.9 - 10.3 mg/dL   Total Protein 7.1 6.5 - 8.1 g/dL   Albumin 1.6 (L) 3.5 - 5.0 g/dL   AST 86 (H) 15 - 41 U/L   ALT 31 0 - 44 U/L   Alkaline Phosphatase 250 (H) 38 - 126 U/L   Total Bilirubin 6.7 (H) 0.3 - 1.2 mg/dL   GFR calc non Af Amer 54 (L) >60 mL/min   GFR calc Af Amer >60 >60 mL/min   Anion gap 8 5 - 15    Comment: Performed at [facility], [address], [place], [place] [address]  Lactate dehydrogenase     Status: Abnormal   Collection Time: 10/09/19  3:32 PM  Result Value Ref Range   LDH 282 (H) 98 - 192 U/L    Comment: Performed at [facility], [address], [place], [place] [address]  Bilirubin, direct     Status: Abnormal   Collection Time: 10/09/19  3:32 PM  Result Value Ref Range   Bilirubin, Direct 3.0 (H) 0.0 - 0.2 mg/dL    Comment: Performed at [facility], [place]  [address], [place], [place] [address]  Reticulocytes     Status: Abnormal   Collection Time: 10/09/19  3:32 PM  Result Value Ref Range   Retic Ct Pct 4.5 (H) 0.4 - 3.1 %   RBC. 2.10 (L) 4.22 - 5.81 MIL/uL   Retic Count, Absolute 93.7 19.0 - 186.0 K/uL   Immature Retic Fract 11.1 2.3 - 15.9 %    Comment: Performed at [facility], [address], [place], [place] [address]  Fibrinogen     Status: Abnormal   Collection Time: 10/09/19  3:32 PM  Result Value Ref Range   Fibrinogen 188 (L) 210 - 475 mg/dL    Comment: Performed at [facility],  [address], [place], [place] [address]  Protime-INR     Status: Abnormal   Collection Time: 10/09/19  3:32 PM  Result Value Ref Range   Prothrombin Time 23.0 (H) 11.4 - 15.2 seconds   INR 2.0 (H) 0.8 - 1.2    Comment: (NOTE) INR goal varies based on device and disease states. Performed at [facility], [address], [place], [place] [address]   APTT     Status: Abnormal   Collection Time: 10/09/19  3:32 PM  Result Value Ref Range   aPTT 43 (H) 24 - 36 seconds    Comment:        IF BASELINE aPTT IS ELEVATED, SUGGEST PATIENT RISK ASSESSMENT BE USED TO DETERMINE APPROPRIATE ANTICOAGULANT THERAPY. Performed at [facility], [address] [place], [place] [address]     RADIOGRAPHIC STUDIES: I have personally reviewed the radiological images as listed and agreed with the findings in the report. US Abdomen Complete  Result Date: 09/29/2019 CLINICAL DATA:  Abnormal LFTs.  Cholecystectomy. EXAM: ABDOMEN ULTRASOUND COMPLETE COMPARISON:  CT 10/29/2014. FINDINGS: Gallbladder: Cholecystectomy. Common bile duct: Diameter: 3 mm Liver: Heterogeneous lobular hepatic parenchymal pattern noted. This suggest cirrhosis. Reversal of portal venous flow noted. IVC: No abnormality visualized. Pancreas: Limited visualization due to overlying bowel gas. Spleen: Size and appearance within normal limits. Right Kidney: Length: 12.8 cm. Echogenicity within normal limits. No mass or hydronephrosis visualized. Left Kidney: Length: 13.8 cm. Echogenicity within normal limits. No mass or hydronephrosis visualized. Abdominal aorta: No aneurysm visualized. Other findings: Moderate ascites. IMPRESSION: 1.  Cholecystectomy.  No biliary distention. 2. Heterogeneous lobular hepatic parenchymal pattern. This suggest cirrhosis. No focal hepatic abnormality identified. Reversal of portal venous flow noted. 3.  Moderate ascites. Electronically Signed   By: Marcello Moores  Register   On: 09/29/2019 08:48    ASSESSMENT & PLAN:  Macrocytic  anemia 1.  Macrocytic anemia: -He was referred to [place] from Dr. Hilma Favors for abnormal CBC on 09/20/2019.  Hemoglobin was 8.5 with MCV of 99.  Platelet count was 77. -Ferritin was 2410 with percent saturation more than 88%.  B12 was normal.  Reticulocyte count was 2.9%. -He was also jaundiced with the elevated bilirubin of 8.5.  TSH was normal. -He reported hematuria.  He also has occasional nosebleeds. -He was reportedly seen by Dr. Abran Duke in [place] for 1 time in 2015. -I will repeat his CBC.  We will check his bilirubin fractions.  We will also check methylmalonic acid and copper. -Microcytic anemia likely from liver disease.  2.  Cirrhosis: -He reported drinking 1-2 drinks of wine every day for a long time. -Ultrasound of the abdomen on 09/29/2019 showed heterogeneous lobular hepatic parenchymal pattern suggesting cirrhosis.  No focal liver abnormality.  Reversal of portal venous flow noted.  Moderate amount of ascites  present. -Hepatitis panel was negative.  LFTs are elevated. -We will repeat LFTs today.  He was counseled not to drink alcohol anymore. -We will also check PT, PTT and fibrinogen. 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 -Hence I would check for hemochromatosis panel.  No family history of hemochromatosis. -I have also recommended doing a CT scan of the abdomen and pelvis with contrast if his creatinine allows.  This will also allow [place] to look at his kidneys.  3.  Weight loss: -Patient reported severe tiredness.  Decrease in appetite reported. -He lost 30 pounds in the last 1 year.  Denies any fevers or night sweats. -We will do CT of the abdomen and pelvis. -He reports some hematuria.  We will check his UA.  If there is any sign of infection will call in antibiotic.     All questions were answered. The patient knows to call the clinic with any problems, questions or  concerns.     Derek Jack, MD 10/09/19 6:05 PM

## 2019-10-09 NOTE — Assessment & Plan Note (Addendum)
1.  Macrocytic anemia: -He was referred to [place] from Dr. Phillips Odor for abnormal CBC on 09/20/2019.  Hemoglobin was 8.5 with MCV of 99.  Platelet count was 77. -Ferritin was 2410 with percent saturation more than 88%.  B12 was normal.  Reticulocyte count was 2.9%. -He was also jaundiced with the elevated bilirubin of 8.5.  TSH was normal. -He reported hematuria.  He also has occasional nosebleeds. -He was reportedly seen by Dr. Laurie Panda in [place] for 1 time in 2015. -I will repeat his CBC.  We will check his bilirubin fractions.  We will also check methylmalonic acid and copper. -Microcytic anemia likely from liver disease.  2.  Cirrhosis: -He reported drinking 1-2 drinks of wine every day for a long time. -Ultrasound of the abdomen on 09/29/2019 showed heterogeneous lobular hepatic parenchymal pattern suggesting cirrhosis.  No focal liver abnormality.  Reversal of portal venous flow noted.  Moderate amount of ascites present. -Hepatitis panel was negative.  LFTs are elevated. -We will repeat LFTs today.  He was counseled not to drink alcohol anymore. -We will also check PT, PTT and fibrinogen. 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 -Hence I would check for hemochromatosis panel.  No family history of hemochromatosis. -I have also recommended doing a CT scan of the abdomen and pelvis with contrast if his creatinine allows.  This will also allow [place] to look at his kidneys.  3.  Weight loss: -Patient reported severe tiredness.  Decrease in appetite reported. -He lost 30 pounds in the last 1 year.  Denies any fevers or night sweats. -We will do CT of the abdomen and pelvis. -He reports some hematuria.  We will check his UA.  If there is any sign of infection will call in antibiotic.

## 2019-10-09 NOTE — Patient Instructions (Addendum)
[facility] at [facility] Discharge Instructions  You were seen today by Dr. Ellin Saba. He went over your history, family history and how you've been feeling lately. He will schedule you for a CT for further evaluation of your liver. You will have blood work done before you leave the hospital today. He will see you back after your scan for follow up.   Thank you for choosing [facility] at [facility] to provide your oncology and hematology care.  To afford each patient quality time with our provider, please arrive at least 15 minutes before your scheduled appointment time.   If you have a lab appointment with the Cancer Center please come in thru the  Main Entrance and check in at the main information desk  You need to re-schedule your appointment should you arrive 10 or more minutes late.  We strive to give you quality time with our providers, and arriving late affects you and other patients whose appointments are after yours.  Also, if you no show three or more times for appointments you may be dismissed from the clinic at the providers discretion.     Again, thank you for choosing [facility].  Our hope is that these requests will decrease the amount of time that you wait before being seen by our physicians.       _____________________________________________________________  Should you have questions after your visit to [facility], please contact our office at [phone] between the hours of 8:00 a.m. and 4:30 p.m.  Voicemails left after 4:00 p.m. will not be returned until the following business day.  For prescription refill requests, have your pharmacy contact our office and allow 72 hours.    Cancer Center Support Programs:   > Cancer Support Group  2nd Tuesday of the month 1pm-2pm, Journey Room

## 2019-10-11 LAB — HEMOCHROMATOSIS DNA-PCR(C282Y,H63D)

## 2019-10-12 LAB — COPPER, SERUM: Copper: 111 ug/dL (ref 69–132)

## 2019-10-13 LAB — METHYLMALONIC ACID, SERUM: Methylmalonic Acid, Quantitative: 58 nmol/L (ref 0–378)

## 2019-10-17 ENCOUNTER — Encounter (HOSPITAL_COMMUNITY): Payer: Self-pay

## 2019-10-17 DIAGNOSIS — D539 Nutritional anemia, unspecified: Secondary | ICD-10-CM

## 2019-10-18 MED ORDER — FUROSEMIDE 20 MG PO TABS: 20.0000 mg | ORAL_TABLET | Freq: Every day | ORAL | 1 refills | Status: DC

## 2019-10-25 ENCOUNTER — Ambulatory Visit (HOSPITAL_COMMUNITY)
Admission: RE | Admit: 2019-10-25 | Discharge: 2019-10-25 | Disposition: A | Discharge status: Home / Self Care | Payer: BC Managed Care – PPO | Source: Ambulatory Visit | Attending: Hematology | Admitting: Hematology

## 2019-10-25 ENCOUNTER — Inpatient Hospital Stay (HOSPITAL_COMMUNITY): Payer: BC Managed Care – PPO

## 2019-10-25 ENCOUNTER — Other Ambulatory Visit: Payer: Self-pay

## 2019-10-25 DIAGNOSIS — R634 Abnormal weight loss: Secondary | ICD-10-CM | POA: Diagnosis not present

## 2019-10-25 DIAGNOSIS — R319 Hematuria, unspecified: Secondary | ICD-10-CM | POA: Diagnosis not present

## 2019-10-25 DIAGNOSIS — R17 Unspecified jaundice: Secondary | ICD-10-CM | POA: Diagnosis not present

## 2019-10-25 DIAGNOSIS — Z87891 Personal history of nicotine dependence: Secondary | ICD-10-CM | POA: Diagnosis not present

## 2019-10-25 DIAGNOSIS — Z79899 Other long term (current) drug therapy: Secondary | ICD-10-CM | POA: Diagnosis not present

## 2019-10-25 DIAGNOSIS — M7989 Other specified soft tissue disorders: Secondary | ICD-10-CM | POA: Diagnosis not present

## 2019-10-25 DIAGNOSIS — D539 Nutritional anemia, unspecified: Secondary | ICD-10-CM | POA: Diagnosis not present

## 2019-10-25 DIAGNOSIS — K7460 Unspecified cirrhosis of liver: Secondary | ICD-10-CM | POA: Diagnosis not present

## 2019-10-25 DIAGNOSIS — J90 Pleural effusion, not elsewhere classified: Secondary | ICD-10-CM | POA: Diagnosis not present

## 2019-10-25 DIAGNOSIS — R188 Other ascites: Secondary | ICD-10-CM | POA: Diagnosis not present

## 2019-10-25 DIAGNOSIS — N200 Calculus of kidney: Secondary | ICD-10-CM | POA: Diagnosis not present

## 2019-10-25 DIAGNOSIS — Z148 Genetic carrier of other disease: Secondary | ICD-10-CM | POA: Diagnosis not present

## 2019-10-25 DIAGNOSIS — R5383 Other fatigue: Secondary | ICD-10-CM | POA: Diagnosis not present

## 2019-10-25 DIAGNOSIS — R7989 Other specified abnormal findings of blood chemistry: Secondary | ICD-10-CM | POA: Diagnosis not present

## 2019-10-25 LAB — COMPREHENSIVE METABOLIC PANEL
ALT: 28 U/L (ref 0–44)
AST: 64 U/L — ABNORMAL HIGH (ref 15–41)
Albumin: 1.8 g/dL — ABNORMAL LOW (ref 3.5–5.0)
Alkaline Phosphatase: 187 U/L — ABNORMAL HIGH (ref 38–126)
Anion gap: 8 (ref 5–15)
BUN: 21 mg/dL — ABNORMAL HIGH (ref 6–20)
CO2: 23 mmol/L (ref 22–32)
Calcium: 8.5 mg/dL — ABNORMAL LOW (ref 8.9–10.3)
Chloride: 102 mmol/L (ref 98–111)
Creatinine, Ser: 1.53 mg/dL — ABNORMAL HIGH (ref 0.61–1.24)
GFR calc Af Amer: 56 mL/min — ABNORMAL LOW (ref >60)
GFR calc non Af Amer: 49 mL/min — ABNORMAL LOW (ref >60)
Glucose, Bld: 152 mg/dL — ABNORMAL HIGH (ref 70–99)
Potassium: 3.6 mmol/L (ref 3.5–5.1)
Sodium: 133 mmol/L — ABNORMAL LOW (ref 135–145)
Total Bilirubin: 7 mg/dL — ABNORMAL HIGH (ref 0.3–1.2)
Total Protein: 6.8 g/dL (ref 6.5–8.1)

## 2019-10-25 LAB — CBC WITH DIFFERENTIAL/PLATELET
Abs Immature Granulocytes: 0.05 10*3/uL (ref 0.00–0.07)
Basophils Absolute: 0.1 10*3/uL (ref 0.0–0.1)
Basophils Relative: 1 %
Eosinophils Absolute: 0.2 10*3/uL (ref 0.0–0.5)
Eosinophils Relative: 2 %
HCT: 23.9 % — ABNORMAL LOW (ref 39.0–52.0)
Hemoglobin: 7.8 g/dL — ABNORMAL LOW (ref 13.0–17.0)
Immature Granulocytes: 1 %
Lymphocytes Relative: 15 %
Lymphs Abs: 1.1 10*3/uL (ref 0.7–4.0)
MCH: 36.8 pg — ABNORMAL HIGH (ref 26.0–34.0)
MCHC: 32.6 g/dL (ref 30.0–36.0)
MCV: 112.7 fL — ABNORMAL HIGH (ref 80.0–100.0)
Monocytes Absolute: 0.8 10*3/uL (ref 0.1–1.0)
Monocytes Relative: 10 %
Neutro Abs: 5.3 10*3/uL (ref 1.7–7.7)
Neutrophils Relative %: 71 %
Platelets: 125 10*3/uL — ABNORMAL LOW (ref 150–400)
RBC: 2.12 MIL/uL — ABNORMAL LOW (ref 4.22–5.81)
RDW: 16.2 % — ABNORMAL HIGH (ref 11.5–15.5)
WBC: 7.5 10*3/uL (ref 4.0–10.5)
nRBC: 0 % (ref 0.0–0.2)

## 2019-10-25 MED ORDER — IOHEXOL 300 MG/ML  SOLN
100.0000 mL | Freq: Once | INTRAMUSCULAR | Status: AC | PRN
  Administered 2019-10-25: 100 mL via INTRAVENOUS

## 2019-10-30 ENCOUNTER — Inpatient Hospital Stay (HOSPITAL_COMMUNITY): Payer: BC Managed Care – PPO | Admitting: Hematology

## 2019-10-30 ENCOUNTER — Encounter (HOSPITAL_COMMUNITY): Payer: Self-pay | Admitting: Hematology

## 2019-10-30 ENCOUNTER — Other Ambulatory Visit: Payer: Self-pay

## 2019-10-30 VITALS — BP 119/69 | HR 109 | Temp 98.2°F | Resp 18 | Wt 201.6 lb

## 2019-10-30 DIAGNOSIS — R7989 Other specified abnormal findings of blood chemistry: Secondary | ICD-10-CM | POA: Diagnosis not present

## 2019-10-30 DIAGNOSIS — R188 Other ascites: Secondary | ICD-10-CM | POA: Diagnosis not present

## 2019-10-30 DIAGNOSIS — R634 Abnormal weight loss: Secondary | ICD-10-CM | POA: Diagnosis not present

## 2019-10-30 DIAGNOSIS — J90 Pleural effusion, not elsewhere classified: Secondary | ICD-10-CM | POA: Diagnosis not present

## 2019-10-30 DIAGNOSIS — R609 Edema, unspecified: Secondary | ICD-10-CM

## 2019-10-30 DIAGNOSIS — R5383 Other fatigue: Secondary | ICD-10-CM | POA: Diagnosis not present

## 2019-10-30 DIAGNOSIS — Z79899 Other long term (current) drug therapy: Secondary | ICD-10-CM | POA: Diagnosis not present

## 2019-10-30 DIAGNOSIS — M7989 Other specified soft tissue disorders: Secondary | ICD-10-CM | POA: Diagnosis not present

## 2019-10-30 DIAGNOSIS — Z148 Genetic carrier of other disease: Secondary | ICD-10-CM | POA: Diagnosis not present

## 2019-10-30 DIAGNOSIS — D539 Nutritional anemia, unspecified: Secondary | ICD-10-CM

## 2019-10-30 DIAGNOSIS — K7460 Unspecified cirrhosis of liver: Secondary | ICD-10-CM | POA: Diagnosis not present

## 2019-10-30 DIAGNOSIS — R319 Hematuria, unspecified: Secondary | ICD-10-CM | POA: Diagnosis not present

## 2019-10-30 DIAGNOSIS — Z87891 Personal history of nicotine dependence: Secondary | ICD-10-CM | POA: Diagnosis not present

## 2019-10-30 MED ORDER — SPIRONOLACTONE 25 MG PO TABS: 25.0000 mg | ORAL_TABLET | Freq: Every day | ORAL | 1 refills | Status: AC

## 2019-10-30 NOTE — Assessment & Plan Note (Signed)
1.  Macrocytic anemia: -Referred by Dr. Phillips Odor for abnormal CBC on 09/20/2019 with hemoglobin 8.5 MCV 99, platelet count 77. -B12, folic acid, methylmalonic acid and copper levels were normal. -Denies any bleeding per rectum or melena. -Repeat CBC was 7.8 hemoglobin and MCV of 112. -I plan to repeat CBC next week.  We will consider bone marrow aspiration and biopsy.  2.  Cirrhosis: -He reported drinking 1-2 drinks of wine every day for a long time. -We reviewed results of the CT AP from 10/25/2018 which showed cirrhosis.  2 subcentimeter foci of low-attenuation identified within the liver too small to characterize.  New complex exophytic lesion in the lower pole of the right kidney identified.  Nonobstructing right renal calculus.  Small right pleural effusion.  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 -I have recommended a GI consultation.  Total bilirubin is between 7 and 8.  3.  Ascites: -He gained 18 pounds since last visit 3 weeks ago. -He is taking Lasix 20 mg but it is not helping. -I will increase Lasix to 40 mg.  I will add spironolactone 25 mg daily in the mornings. -His abdomen is soft today.  If it gets any worse, will consider paracentesis.  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 -We talked about the results which showed carrier state for C282Y mutation.  I think the elevated ferritin and percent saturation is secondary to liver disease.

## 2019-10-30 NOTE — Patient Instructions (Signed)
[facility] at [facility] Discharge Instructions  You were seen today by Dr. Ellin Saba. He went over your recent lab results. He will refer you to GI to evaluate your liver. He will increase your fluid pills (Lasix) to 40mg  every morning, he will send in a new prescription for spironolactone also a fluid pill to take every morning. He will see you back in 1 week for labs and follow up.   Thank you for choosing [facility] at [facility] to provide your oncology and hematology care.  To afford each patient quality time with our provider, please arrive at least 15 minutes before your scheduled appointment time.   If you have a lab appointment with the Cancer Center please come in thru the  Main Entrance and check in at the main information desk  You need to re-schedule your appointment should you arrive 10 or more minutes late.  We strive to give you quality time with our providers, and arriving late affects you and other patients whose appointments are after yours.  Also, if you no show three or more times for appointments you may be dismissed from the clinic at the providers discretion.     Again, thank you for choosing [facility].  Our hope is that these requests will decrease the amount of time that you wait before being seen by our physicians.       _____________________________________________________________  Should you have questions after your visit to [facility], please contact our office at [phone] between the hours of 8:00 a.m. and 4:30 p.m.  Voicemails left after 4:00 p.m. will not be returned until the following business day.  For prescription refill requests, have your pharmacy contact our office and allow 72 hours.    Cancer Center Support Programs:   > Cancer Support Group  2nd Tuesday of the month 1pm-2pm, Journey Room

## 2019-10-30 NOTE — Progress Notes (Signed)
[facility] [address], [place] [address]   CLINIC:  Medical Oncology/Hematology  PCP:  Assunta Found, MD [address] [place] [address] [phone]   REASON FOR VISIT:  Follow-up for macrocytic anemia, ascites, and cirrhosis.  CURRENT THERAPY: Under work-up.   INTERVAL HISTORY:  [name] 59 y.o. male seen for follow-up of microcytic anemia, cirrhosis and ascites.  Denies any bleeding per rectum or melena.  Reported some weight gain of 18 pounds.  Appetite and energy levels are 25%.  He is taking Lasix 20 mg daily which is not completely helping with urination.  Has occasional nosebleeds.    REVIEW OF SYSTEMS:  Review of Systems  Cardiovascular: Positive for leg swelling.  Gastrointestinal: Positive for abdominal distention.  All other systems reviewed and are negative.    PAST MEDICAL/SURGICAL HISTORY:  History reviewed. No pertinent past medical history. Past Surgical History:  Procedure Laterality Date  . CHOLECYSTECTOMY       SOCIAL HISTORY:  Social History   Socioeconomic History  . Marital status: Married    Spouse name: Ruby  . Number of children: 2  . Years of education: Not on file  . Highest education level: Not on file  Occupational History  . Not on file  Tobacco Use  . Smoking status: Never Smoker  . Smokeless tobacco: Never Used  Substance and Sexual Activity  . Alcohol use: Yes    Alcohol/week: 2.0 standard drinks    Types: 2 Glasses of wine per week    Comment: Pt drinks every weekend  . Drug use: Never  . Sexual activity: Not on file  Other Topics Concern  . Not on file  Social History Narrative  . Not on file   Social Determinants of Health   Financial Resource Strain:   . Difficulty of Paying Living Expenses:   Food Insecurity:   . Worried About Programme researcher, broadcasting/film/video in the Last Year:   . Barista in the Last Year:   Transportation Needs:   . Freight forwarder (Medical):   Marland Kitchen  Lack of Transportation (Non-Medical):   Physical Activity:   . Days of Exercise per Week:   . Minutes of Exercise per Session:   Stress:   . Feeling of Stress :   Social Connections:   . Frequency of Communication with Friends and Family:   . Frequency of Social Gatherings with Friends and Family:   . Attends Religious Services:   . Active Member of Clubs or Organizations:   . Attends Banker Meetings:   Marland Kitchen Marital Status:   Intimate Partner Violence:   . Fear of Current or Ex-Partner:   . Emotionally Abused:   Marland Kitchen Physically Abused:   . Sexually Abused:     FAMILY HISTORY:  Family History  Problem Relation Age of Onset  . Heart attack Father     CURRENT MEDICATIONS:  Outpatient Encounter Medications as of 10/30/2019  Medication Sig  . Ascorbic Acid (VITAMIN C) 100 MG tablet Take 500 mg by mouth daily.  . Cinnamon 500 MG capsule Take 500 mg by mouth daily.  . furosemide (LASIX) 20 MG tablet Take 1 tablet (20 mg total) by mouth daily.  . Garlic 100 MG TABS Take 100 mg by mouth daily.  . Misc Natural Products (TART CHERRY ADVANCED PO) Take by mouth daily.  . Multiple Vitamins-Minerals (MENS ONE DAILY PO) Take by mouth daily.  Marland Kitchen spironolactone (ALDACTONE) 25 MG tablet  Take 1 tablet (25 mg total) by mouth daily.  . [DISCONTINUED] ciprofloxacin (CIPRO) 500 MG tablet Take 1 tablet (500 mg total) by mouth 2 (two) times daily.   No facility-administered encounter medications on file as of 10/30/2019.    ALLERGIES:  Not on File   PHYSICAL EXAM:  ECOG Performance status: 1  Vitals:   10/30/19 0945  BP: 119/69  Pulse: (!) 109  Resp: 18  Temp: 98.2 F (36.8 C)  SpO2: 100%   Filed Weights   10/30/19 0945  Weight: 201 lb 9.6 oz (91.4 kg)    Physical Exam Vitals reviewed.  Constitutional:      Appearance: Normal appearance.  Cardiovascular:     Rate and Rhythm: Normal rate and regular rhythm.     Heart sounds: Normal heart sounds.  Pulmonary:     Effort:  Pulmonary effort is normal.     Breath sounds: Normal breath sounds.  Abdominal:     General: There is distension.     Palpations: Abdomen is soft. There is no mass.  Musculoskeletal:     Right lower leg: Edema present.     Left lower leg: Edema present.  Skin:    General: Skin is warm.  Neurological:     General: No focal deficit present.     Mental Status: He is alert and oriented to person, place, and time.  Psychiatric:        Mood and Affect: Mood normal.        Behavior: Behavior normal.      LABORATORY DATA:  I have reviewed the labs as listed.  CBC    Component Value Date/Time   WBC 7.5 10/25/2019 1549   RBC 2.12 (L) 10/25/2019 1549   HGB 7.8 (L) 10/25/2019 1549   HCT 23.9 (L) 10/25/2019 1549   PLT 125 (L) 10/25/2019 1549   MCV 112.7 (H) 10/25/2019 1549   MCH 36.8 (H) 10/25/2019 1549   MCHC 32.6 10/25/2019 1549   RDW 16.2 (H) 10/25/2019 1549   LYMPHSABS 1.1 10/25/2019 1549   MONOABS 0.8 10/25/2019 1549   EOSABS 0.2 10/25/2019 1549   BASOSABS 0.1 10/25/2019 1549   CMP Latest Ref Rng & Units 10/25/2019 10/09/2019  Glucose 70 - 99 mg/dL 152(H) 163(H)  BUN 6 - 20 mg/dL 21(H) 21(H)  Creatinine 0.61 - 1.24 mg/dL 1.53(H) 1.40(H)  Sodium 135 - 145 mmol/L 133(L) 129(L)  Potassium 3.5 - 5.1 mmol/L 3.6 5.0  Chloride 98 - 111 mmol/L 102 100  CO2 22 - 32 mmol/L 23 21(L)  Calcium 8.9 - 10.3 mg/dL 8.5(L) 8.5(L)  Total Protein 6.5 - 8.1 g/dL 6.8 7.1  Total Bilirubin 0.3 - 1.2 mg/dL 7.0(H) 6.7(H)  Alkaline Phos 38 - 126 U/L 187(H) 250(H)  AST 15 - 41 U/L 64(H) 86(H)  ALT 0 - 44 U/L 28 31       DIAGNOSTIC IMAGING:  I have independently reviewed the scans and discussed with the patient.    ASSESSMENT & PLAN:   Macrocytic anemia 1.  Macrocytic anemia: -Referred by Dr. Hilma Favors for abnormal CBC on 09/20/2019 with hemoglobin 8.5 MCV 99, platelet count 77. -N39, folic acid, methylmalonic acid and copper levels were normal. -Denies any bleeding per rectum or  melena. -Repeat CBC was 7.8 hemoglobin and MCV of 112. -I plan to repeat CBC next week.  We will consider bone marrow aspiration and biopsy.  2.  Cirrhosis: -He reported drinking 1-2 drinks of wine every day for a long time. -We reviewed results  of the CT AP from 10/25/2018 which showed cirrhosis.  2 subcentimeter foci of low-attenuation identified within the liver too small to characterize.  New complex exophytic lesion in the lower pole of the right kidney identified.  Nonobstructing right renal calculus.  Small right pleural effusion.  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 -I have recommended a GI consultation.  Total bilirubin is between 7 and 8.  3.  Ascites: -He gained 18 pounds since last visit 3 weeks ago. -He is taking Lasix 20 mg but it is not helping. -I will increase Lasix to 40 mg.  I will add spironolactone 25 mg daily in the mornings. -His abdomen is soft today.  If it gets any worse, will consider paracentesis.  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 -We talked about the results which showed carrier state for C282Y mutation.  I think the elevated ferritin and percent saturation is secondary to liver disease.      Orders placed this encounter:  Orders Placed This Encounter  Procedures  . CBC with Differential/Platelet  . Comprehensive metabolic panel      Doreatha Massed, MD [facility] [phone]

## 2019-10-31 ENCOUNTER — Encounter (INDEPENDENT_AMBULATORY_CARE_PROVIDER_SITE_OTHER): Payer: Self-pay | Admitting: Gastroenterology

## 2019-11-06 ENCOUNTER — Inpatient Hospital Stay (HOSPITAL_COMMUNITY): Payer: BC Managed Care – PPO | Admitting: Hematology

## 2019-11-06 ENCOUNTER — Other Ambulatory Visit: Payer: Self-pay

## 2019-11-06 ENCOUNTER — Inpatient Hospital Stay (HOSPITAL_COMMUNITY): Payer: BC Managed Care – PPO

## 2019-11-06 ENCOUNTER — Encounter (HOSPITAL_COMMUNITY): Payer: Self-pay | Admitting: Hematology

## 2019-11-06 VITALS — BP 124/74 | HR 100 | Temp 96.8°F | Resp 18 | Wt 206.0 lb

## 2019-11-06 DIAGNOSIS — K7460 Unspecified cirrhosis of liver: Secondary | ICD-10-CM | POA: Diagnosis not present

## 2019-11-06 DIAGNOSIS — K7031 Alcoholic cirrhosis of liver with ascites: Secondary | ICD-10-CM

## 2019-11-06 DIAGNOSIS — D539 Nutritional anemia, unspecified: Secondary | ICD-10-CM | POA: Diagnosis not present

## 2019-11-06 DIAGNOSIS — R188 Other ascites: Secondary | ICD-10-CM | POA: Diagnosis not present

## 2019-11-06 DIAGNOSIS — Z79899 Other long term (current) drug therapy: Secondary | ICD-10-CM | POA: Diagnosis not present

## 2019-11-06 DIAGNOSIS — R319 Hematuria, unspecified: Secondary | ICD-10-CM | POA: Diagnosis not present

## 2019-11-06 DIAGNOSIS — R7989 Other specified abnormal findings of blood chemistry: Secondary | ICD-10-CM | POA: Diagnosis not present

## 2019-11-06 DIAGNOSIS — R5383 Other fatigue: Secondary | ICD-10-CM | POA: Diagnosis not present

## 2019-11-06 DIAGNOSIS — Z87891 Personal history of nicotine dependence: Secondary | ICD-10-CM | POA: Diagnosis not present

## 2019-11-06 DIAGNOSIS — R634 Abnormal weight loss: Secondary | ICD-10-CM | POA: Diagnosis not present

## 2019-11-06 DIAGNOSIS — Z148 Genetic carrier of other disease: Secondary | ICD-10-CM | POA: Diagnosis not present

## 2019-11-06 DIAGNOSIS — J90 Pleural effusion, not elsewhere classified: Secondary | ICD-10-CM | POA: Diagnosis not present

## 2019-11-06 DIAGNOSIS — M7989 Other specified soft tissue disorders: Secondary | ICD-10-CM | POA: Diagnosis not present

## 2019-11-06 LAB — COMPREHENSIVE METABOLIC PANEL
ALT: 25 U/L (ref 0–44)
AST: 55 U/L — ABNORMAL HIGH (ref 15–41)
Albumin: 1.6 g/dL — ABNORMAL LOW (ref 3.5–5.0)
Alkaline Phosphatase: 168 U/L — ABNORMAL HIGH (ref 38–126)
Anion gap: 9 (ref 5–15)
BUN: 30 mg/dL — ABNORMAL HIGH (ref 6–20)
CO2: 21 mmol/L — ABNORMAL LOW (ref 22–32)
Calcium: 8 mg/dL — ABNORMAL LOW (ref 8.9–10.3)
Chloride: 99 mmol/L (ref 98–111)
Creatinine, Ser: 1.94 mg/dL — ABNORMAL HIGH (ref 0.61–1.24)
GFR calc Af Amer: 42 mL/min — ABNORMAL LOW (ref >60)
GFR calc non Af Amer: 37 mL/min — ABNORMAL LOW (ref >60)
Glucose, Bld: 190 mg/dL — ABNORMAL HIGH (ref 70–99)
Potassium: 3.7 mmol/L (ref 3.5–5.1)
Sodium: 129 mmol/L — ABNORMAL LOW (ref 135–145)
Total Bilirubin: 6.2 mg/dL — ABNORMAL HIGH (ref 0.3–1.2)
Total Protein: 6 g/dL — ABNORMAL LOW (ref 6.5–8.1)

## 2019-11-06 LAB — CBC WITH DIFFERENTIAL/PLATELET
Abs Immature Granulocytes: 0.09 10*3/uL — ABNORMAL HIGH (ref 0.00–0.07)
Basophils Absolute: 0.1 10*3/uL (ref 0.0–0.1)
Basophils Relative: 1 %
Eosinophils Absolute: 0.3 10*3/uL (ref 0.0–0.5)
Eosinophils Relative: 4 %
HCT: 23.7 % — ABNORMAL LOW (ref 39.0–52.0)
Hemoglobin: 7.6 g/dL — ABNORMAL LOW (ref 13.0–17.0)
Immature Granulocytes: 1 %
Lymphocytes Relative: 16 %
Lymphs Abs: 1.1 10*3/uL (ref 0.7–4.0)
MCH: 35.3 pg — ABNORMAL HIGH (ref 26.0–34.0)
MCHC: 32.1 g/dL (ref 30.0–36.0)
MCV: 110.2 fL — ABNORMAL HIGH (ref 80.0–100.0)
Monocytes Absolute: 1.1 10*3/uL — ABNORMAL HIGH (ref 0.1–1.0)
Monocytes Relative: 15 %
Neutro Abs: 4.5 10*3/uL (ref 1.7–7.7)
Neutrophils Relative %: 63 %
Platelets: 111 10*3/uL — ABNORMAL LOW (ref 150–400)
RBC: 2.15 MIL/uL — ABNORMAL LOW (ref 4.22–5.81)
RDW: 16 % — ABNORMAL HIGH (ref 11.5–15.5)
WBC: 7.2 10*3/uL (ref 4.0–10.5)
nRBC: 0 % (ref 0.0–0.2)

## 2019-11-06 MED ORDER — ALBUMIN HUMAN 25 % IV SOLN: 50.0000 g | Freq: Once | INTRAVENOUS | Status: DC

## 2019-11-06 MED ORDER — TORSEMIDE 20 MG PO TABS: 20.0000 mg | ORAL_TABLET | Freq: Every day | ORAL | 2 refills | Status: DC

## 2019-11-06 NOTE — Patient Instructions (Addendum)
[facility] at [facility] Discharge Instructions  You were seen today by Dr. Ellin Saba. He went over your recent lab results. STOP taking the Lasix. Continue taking the Spironolactone. He will send in a new prescription for Torsemide to start taking tomorrow morning. He will schedule you for a paracentesis with Albumin. He will see you back in 1 week for labs and follow up.   Thank you for choosing [facility] at [facility] to provide your oncology and hematology care.  To afford each patient quality time with our provider, please arrive at least 15 minutes before your scheduled appointment time.   If you have a lab appointment with the Cancer Center please come in thru the  Main Entrance and check in at the main information desk  You need to re-schedule your appointment should you arrive 10 or more minutes late.  We strive to give you quality time with our providers, and arriving late affects you and other patients whose appointments are after yours.  Also, if you no show three or more times for appointments you may be dismissed from the clinic at the providers discretion.     Again, thank you for choosing [facility].  Our hope is that these requests will decrease the amount of time that you wait before being seen by our physicians.       _____________________________________________________________  Should you have questions after your visit to [facility], please contact our office at [phone] between the hours of 8:00 a.m. and 4:30 p.m.  Voicemails left after 4:00 p.m. will not be returned until the following business day.  For prescription refill requests, have your pharmacy contact our office and allow 72 hours.    Cancer Center Support Programs:   > Cancer Support Group  2nd Tuesday of the month 1pm-2pm, Journey Room

## 2019-11-06 NOTE — Progress Notes (Signed)
[facility] [address], [place] [address]   CLINIC:  Medical Oncology/Hematology  PCP:  Assunta Found, MD [address] [place] [address] [phone]   REASON FOR VISIT:  Follow-up for macrocytic anemia, ascites, and cirrhosis.  CURRENT THERAPY: Under work-up.   INTERVAL HISTORY:  [name] 59 y.o. male seen for follow-up of macrocytic anemia, cirrhosis, ascites and fluid retention.  Does not report any bleeding per rectum or melena.  He increase the dose of Lasix to 40 mg daily along with spironolactone 25 mg daily last week.  However his weight has gone up by 6 pounds.  Appetite is 50%.  He is not eating much.  He reports swelling in the scrotal area.  Also leg swelling was described.  He is reportedly drinking 1 can of Ensure per day.   REVIEW OF SYSTEMS:  Review of Systems  Respiratory: Positive for shortness of breath.   Cardiovascular: Positive for leg swelling.  Gastrointestinal: Positive for abdominal distention.  All other systems reviewed and are negative.    PAST MEDICAL/SURGICAL HISTORY:  History reviewed. No pertinent past medical history. Past Surgical History:  Procedure Laterality Date  . CHOLECYSTECTOMY       SOCIAL HISTORY:  Social History   Socioeconomic History  . Marital status: Married    Spouse name: Ruby  . Number of children: 2  . Years of education: Not on file  . Highest education level: Not on file  Occupational History  . Not on file  Tobacco Use  . Smoking status: Never Smoker  . Smokeless tobacco: Never Used  Substance and Sexual Activity  . Alcohol use: Yes    Alcohol/week: 2.0 standard drinks    Types: 2 Glasses of wine per week    Comment: Pt drinks every weekend  . Drug use: Never  . Sexual activity: Not on file  Other Topics Concern  . Not on file  Social History Narrative  . Not on file   Social Determinants of Health   Financial Resource Strain:   . Difficulty of Paying  Living Expenses:   Food Insecurity:   . Worried About Programme researcher, broadcasting/film/video in the Last Year:   . Barista in the Last Year:   Transportation Needs:   . Freight forwarder (Medical):   Marland Kitchen Lack of Transportation (Non-Medical):   Physical Activity:   . Days of Exercise per Week:   . Minutes of Exercise per Session:   Stress:   . Feeling of Stress :   Social Connections:   . Frequency of Communication with Friends and Family:   . Frequency of Social Gatherings with Friends and Family:   . Attends Religious Services:   . Active Member of Clubs or Organizations:   . Attends Banker Meetings:   Marland Kitchen Marital Status:   Intimate Partner Violence:   . Fear of Current or Ex-Partner:   . Emotionally Abused:   Marland Kitchen Physically Abused:   . Sexually Abused:     FAMILY HISTORY:  Family History  Problem Relation Age of Onset  . Heart attack Father     CURRENT MEDICATIONS:  Outpatient Encounter Medications as of 11/06/2019  Medication Sig  . Ascorbic Acid (VITAMIN C) 100 MG tablet Take 500 mg by mouth daily.  . Cinnamon 500 MG capsule Take 500 mg by mouth daily.  . Garlic 100 MG TABS Take 100 mg by mouth daily.  . Misc Natural Products (TART CHERRY  ADVANCED PO) Take by mouth daily.  . Multiple Vitamins-Minerals (MENS ONE DAILY PO) Take by mouth daily.  Marland Kitchen spironolactone (ALDACTONE) 25 MG tablet Take 1 tablet (25 mg total) by mouth daily.  . [DISCONTINUED] furosemide (LASIX) 20 MG tablet Take 1 tablet (20 mg total) by mouth daily.  . naproxen sodium (ALEVE) 220 MG tablet Take 220 mg by mouth daily as needed.  . torsemide (DEMADEX) 20 MG tablet Take 1 tablet (20 mg total) by mouth daily.   Facility-Administered Encounter Medications as of 11/06/2019  Medication  . [START ON 11/14/2019] albumin human 25 % solution 50 g    ALLERGIES:  Not on File   PHYSICAL EXAM:  ECOG Performance status: 1  Vitals:   11/06/19 1549  BP: 124/74  Pulse: 100  Resp: 18  Temp: (!) 96.8 F  (36 C)  SpO2: 100%   Filed Weights   11/06/19 1549  Weight: 206 lb (93.4 kg)    Physical Exam Vitals reviewed.  Constitutional:      Appearance: Normal appearance.  Cardiovascular:     Rate and Rhythm: Normal rate and regular rhythm.     Heart sounds: Normal heart sounds.  Pulmonary:     Effort: Pulmonary effort is normal.     Breath sounds: Normal breath sounds.  Abdominal:     General: There is distension.     Palpations: Abdomen is soft. There is no mass.  Musculoskeletal:     Right lower leg: Edema present.     Left lower leg: Edema present.  Skin:    General: Skin is warm.  Neurological:     General: No focal deficit present.     Mental Status: He is alert and oriented to person, place, and time.  Psychiatric:        Mood and Affect: Mood normal.        Behavior: Behavior normal.      LABORATORY DATA:  I have reviewed the labs as listed.  CBC    Component Value Date/Time   WBC 7.2 11/06/2019 1452   RBC 2.15 (L) 11/06/2019 1452   HGB 7.6 (L) 11/06/2019 1452   HCT 23.7 (L) 11/06/2019 1452   PLT 111 (L) 11/06/2019 1452   MCV 110.2 (H) 11/06/2019 1452   MCH 35.3 (H) 11/06/2019 1452   MCHC 32.1 11/06/2019 1452   RDW 16.0 (H) 11/06/2019 1452   LYMPHSABS 1.1 11/06/2019 1452   MONOABS 1.1 (H) 11/06/2019 1452   EOSABS 0.3 11/06/2019 1452   BASOSABS 0.1 11/06/2019 1452   CMP Latest Ref Rng & Units 11/06/2019 10/25/2019 10/09/2019  Glucose 70 - 99 mg/dL 190(H) 152(H) 163(H)  BUN 6 - 20 mg/dL 30(H) 21(H) 21(H)  Creatinine 0.61 - 1.24 mg/dL 1.94(H) 1.53(H) 1.40(H)  Sodium 135 - 145 mmol/L 129(L) 133(L) 129(L)  Potassium 3.5 - 5.1 mmol/L 3.7 3.6 5.0  Chloride 98 - 111 mmol/L 99 102 100  CO2 22 - 32 mmol/L 21(L) 23 21(L)  Calcium 8.9 - 10.3 mg/dL 8.0(L) 8.5(L) 8.5(L)  Total Protein 6.5 - 8.1 g/dL 6.0(L) 6.8 7.1  Total Bilirubin 0.3 - 1.2 mg/dL 6.2(H) 7.0(H) 6.7(H)  Alkaline Phos 38 - 126 U/L 168(H) 187(H) 250(H)  AST 15 - 41 U/L 55(H) 64(H) 86(H)  ALT 0 - 44 U/L  25 28 31        DIAGNOSTIC IMAGING:  I have reviewed scans.    ASSESSMENT & PLAN:   Macrocytic anemia 1.  Macrocytic anemia: -This is combination anemia from liver disease, kidney  disease and possible blood loss. -B12, folic acid, methylmalonic acid and copper levels were normal. -He denies any bleeding per rectum or melena. -Labs today shows hemoglobin 7.6 with MCV of 110. -If hemoglobin drops below 7 or symptomatic, we will consider transfusion.  2.  Cirrhosis: -He reported drinking wine daily for a long time. -CTAP on 10/25/2018 showed cirrhosis, 2 subcentimeter foci of low-attenuation identified within the liver too small to characterize.  New complex exophytic lesion in the lower pole of the right kidney.  Nonobstructing right renal calculus.  Small right pleural effusion.  Ascites and abdominal varices. -Total bilirubin varies from 6-8. -GI consultation request was made.  3.  Ascites: -He has gained 20-22 pounds in the last 1 month. -he was started on Lasix, dose increased to 40 mg with spironolactone 25 mg 1 week ago. -However his weight went up by 6 pounds.  We will discontinue Lasix.  We will start him on torsemide 20 mg.  After 2 days if there is no improvement in weight, he will increase it to 40 mg in the morning.  He will continue spironolactone. -His creatinine has worsened to 1.94 today from 1.5 last week. -He is slowly developing HRS.  We will watch his labs closely.  He will come back in 1 week for follow-up. -His abdomen is slightly distended today.  I have arranged for paracentesis. -I will also give him albumin 50 g at the time of paracentesis. -His albumin is low at 1.6.  I have encouraged him to drink high-protein Ensure 3 times a day.  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 -Elevated ferritin and percent saturation is likely from liver  disease.      Orders placed this encounter:  Orders Placed This Encounter  Procedures  . US Paracentesis      Doreatha Massed, MD [facility] [phone]

## 2019-11-06 NOTE — Assessment & Plan Note (Signed)
1.  Macrocytic anemia: -This is combination anemia from liver disease, kidney disease and possible blood loss. -B12, folic acid, methylmalonic acid and copper levels were normal. -He denies any bleeding per rectum or melena. -Labs today shows hemoglobin 7.6 with MCV of 110. -If hemoglobin drops below 7 or symptomatic, we will consider transfusion.  2.  Cirrhosis: -He reported drinking wine daily for a long time. -CTAP on 10/25/2018 showed cirrhosis, 2 subcentimeter foci of low-attenuation identified within the liver too small to characterize.  New complex exophytic lesion in the lower pole of the right kidney.  Nonobstructing right renal calculus.  Small right pleural effusion.  Ascites and abdominal varices. -Total bilirubin varies from 6-8. -GI consultation request was made.  3.  Ascites: -He has gained 20-22 pounds in the last 1 month. -he was started on Lasix, dose increased to 40 mg with spironolactone 25 mg 1 week ago. -However his weight went up by 6 pounds.  We will discontinue Lasix.  We will start him on torsemide 20 mg.  After 2 days if there is no improvement in weight, he will increase it to 40 mg in the morning.  He will continue spironolactone. -His creatinine has worsened to 1.94 today from 1.5 last week. -He is slowly developing HRS.  We will watch his labs closely.  He will come back in 1 week for follow-up. -His abdomen is slightly distended today.  I have arranged for paracentesis. -I will also give him albumin 50 g at the time of paracentesis. -His albumin is low at 1.6.  I have encouraged him to drink high-protein Ensure 3 times a day.  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 -Elevated ferritin and percent saturation is likely from liver disease.

## 2019-11-13 ENCOUNTER — Other Ambulatory Visit: Payer: Self-pay

## 2019-11-13 ENCOUNTER — Ambulatory Visit (HOSPITAL_COMMUNITY)
Admission: RE | Admit: 2019-11-13 | Discharge: 2019-11-13 | Disposition: A | Discharge status: Home / Self Care | Payer: BC Managed Care – PPO | Source: Ambulatory Visit | Attending: Hematology | Admitting: Hematology

## 2019-11-13 ENCOUNTER — Encounter (HOSPITAL_COMMUNITY): Payer: Self-pay

## 2019-11-13 DIAGNOSIS — K7460 Unspecified cirrhosis of liver: Secondary | ICD-10-CM | POA: Diagnosis not present

## 2019-11-13 DIAGNOSIS — R188 Other ascites: Secondary | ICD-10-CM | POA: Diagnosis not present

## 2019-11-13 NOTE — Discharge Instructions (Signed)
Paracentesis Paracentesis is a procedure to remove excess fluid from the abdomen. When a person collects too much fluid in the abdomen, it is called ascites. Ascites can result from certain conditions, such as chronic scarring of the liver (cirrhosis), cancer, heart failure, or infection. In paracentesis, the fluid is removed using a needle that is inserted through the skin and tissue into the abdomen. This procedure may be done to:  Find the cause of the ascites.  Relieve symptoms that are caused by the ascites, such as pain or shortness of breath. Tell a health care provider about:  Any allergies you have.  All medicines you are taking, including vitamins, herbs, eye drops, creams, and over-the-counter medicines.  Any problems you or family members have had with anesthetic medicines.  Any blood disorders you have.  Any surgeries you have had.  Any medical conditions you have.  Whether you are pregnant or may be pregnant. What are the risks? Generally, this is a safe procedure. However, problems may occur, including:  Infection.  Bleeding.  Injury to an abdominal organ, such as the bowel (large intestine), liver, spleen, or bladder.  Low blood pressure (hypotension).  Mental status changes in people who have liver disease. These changes would be caused by shifts in the balance of fluids and minerals (electrolytes) in the body. What happens before the procedure?  Ask your health care provider about: ? Changing or stopping your regular medicines. This is especially important if you are taking diabetes medicines or blood thinners. ? Taking medicines such as aspirin and ibuprofen. These medicines can thin your blood. Do not take these medicines unless your health care provider tells you to take them. ? Taking over-the-counter medicines, vitamins, herbs, and supplements.  Ask your health care provider what steps will be taken to help prevent infection. These may include washing  skin with a germ-killing soap.  A blood sample may be taken to determine your blood clotting time.  You may have imaging tests.  You will be asked to urinate. What happens during the procedure?   You may be asked to lie on your back with your head raised (elevated).  You will be given a medicine to numb the area (local anesthetic) where the needle will be inserted.  Your abdominal skin will be punctured with a needle.  A drainage tube will be connected to the needle. Fluid will drain through the tube into a container.  After enough fluid has been removed, the needle will be removed.  A sample of the fluid will be sent for examination and testing.  A bandage (dressing) will be placed over the puncture site. The procedure may vary among health care providers and hospitals. What happens after the procedure? It is up to you to get the results of your procedure. Ask your health care provider, or the department that is doing the procedure, when your results will be ready. Summary  Paracentesis is a procedure to remove excess fluid from the abdomen using a needle.  This procedure may be done to find the cause of ascites or to help relieve your symptoms.  Your skin will be numbed with medicine (local anesthetic) before the needle is inserted.  A sample of the removed fluid will be sent for examination and testing. This information is not intended to replace advice given to you by your health care provider. Make sure you discuss any questions you have with your health care provider. Document Revised: 06/28/2018 Document Reviewed: 05/17/2018 Elsevier Patient Education    2020 [employer]  

## 2019-11-13 NOTE — Progress Notes (Signed)
Paracentesis complete no signs of distress. 4L of ascites removed.

## 2019-11-14 ENCOUNTER — Other Ambulatory Visit (HOSPITAL_COMMUNITY): Payer: BC Managed Care – PPO

## 2019-11-14 ENCOUNTER — Ambulatory Visit (HOSPITAL_COMMUNITY): Payer: BC Managed Care – PPO | Admitting: Nurse Practitioner

## 2019-11-15 ENCOUNTER — Encounter (HOSPITAL_COMMUNITY): Payer: Self-pay | Admitting: Nurse Practitioner

## 2019-11-17 ENCOUNTER — Other Ambulatory Visit: Payer: Self-pay

## 2019-11-17 ENCOUNTER — Inpatient Hospital Stay (HOSPITAL_COMMUNITY): Payer: BC Managed Care – PPO | Attending: Hematology

## 2019-11-17 ENCOUNTER — Inpatient Hospital Stay (HOSPITAL_COMMUNITY): Payer: BC Managed Care – PPO | Admitting: Nurse Practitioner

## 2019-11-17 VITALS — BP 121/76 | HR 103 | Temp 96.9°F | Resp 18 | Wt 200.2 lb

## 2019-11-17 DIAGNOSIS — R112 Nausea with vomiting, unspecified: Secondary | ICD-10-CM | POA: Diagnosis not present

## 2019-11-17 DIAGNOSIS — K7460 Unspecified cirrhosis of liver: Secondary | ICD-10-CM | POA: Diagnosis not present

## 2019-11-17 DIAGNOSIS — R5383 Other fatigue: Secondary | ICD-10-CM | POA: Insufficient documentation

## 2019-11-17 DIAGNOSIS — R0602 Shortness of breath: Secondary | ICD-10-CM | POA: Diagnosis not present

## 2019-11-17 DIAGNOSIS — D539 Nutritional anemia, unspecified: Secondary | ICD-10-CM

## 2019-11-17 DIAGNOSIS — Z79899 Other long term (current) drug therapy: Secondary | ICD-10-CM | POA: Diagnosis not present

## 2019-11-17 DIAGNOSIS — R7989 Other specified abnormal findings of blood chemistry: Secondary | ICD-10-CM | POA: Diagnosis not present

## 2019-11-17 DIAGNOSIS — D509 Iron deficiency anemia, unspecified: Secondary | ICD-10-CM | POA: Insufficient documentation

## 2019-11-17 DIAGNOSIS — R188 Other ascites: Secondary | ICD-10-CM | POA: Insufficient documentation

## 2019-11-17 LAB — CBC WITH DIFFERENTIAL/PLATELET
Abs Immature Granulocytes: 0.06 10*3/uL (ref 0.00–0.07)
Basophils Absolute: 0.1 10*3/uL (ref 0.0–0.1)
Basophils Relative: 1 %
Eosinophils Absolute: 0.3 10*3/uL (ref 0.0–0.5)
Eosinophils Relative: 3 %
HCT: 22.2 % — ABNORMAL LOW (ref 39.0–52.0)
Hemoglobin: 7.1 g/dL — ABNORMAL LOW (ref 13.0–17.0)
Immature Granulocytes: 1 %
Lymphocytes Relative: 13 %
Lymphs Abs: 1 10*3/uL (ref 0.7–4.0)
MCH: 36.2 pg — ABNORMAL HIGH (ref 26.0–34.0)
MCHC: 32 g/dL (ref 30.0–36.0)
MCV: 113.3 fL — ABNORMAL HIGH (ref 80.0–100.0)
Monocytes Absolute: 1 10*3/uL (ref 0.1–1.0)
Monocytes Relative: 13 %
Neutro Abs: 5.5 10*3/uL (ref 1.7–7.7)
Neutrophils Relative %: 69 %
Platelets: 108 10*3/uL — ABNORMAL LOW (ref 150–400)
RBC: 1.96 MIL/uL — ABNORMAL LOW (ref 4.22–5.81)
RDW: 18.5 % — ABNORMAL HIGH (ref 11.5–15.5)
WBC: 7.9 10*3/uL (ref 4.0–10.5)
nRBC: 0 % (ref 0.0–0.2)

## 2019-11-17 LAB — COMPREHENSIVE METABOLIC PANEL
ALT: 24 U/L (ref 0–44)
AST: 47 U/L — ABNORMAL HIGH (ref 15–41)
Albumin: 1.6 g/dL — ABNORMAL LOW (ref 3.5–5.0)
Alkaline Phosphatase: 146 U/L — ABNORMAL HIGH (ref 38–126)
Anion gap: 11 (ref 5–15)
BUN: 47 mg/dL — ABNORMAL HIGH (ref 6–20)
CO2: 23 mmol/L (ref 22–32)
Calcium: 8.1 mg/dL — ABNORMAL LOW (ref 8.9–10.3)
Chloride: 96 mmol/L — ABNORMAL LOW (ref 98–111)
Creatinine, Ser: 2.4 mg/dL — ABNORMAL HIGH (ref 0.61–1.24)
GFR calc Af Amer: 33 mL/min — ABNORMAL LOW (ref >60)
GFR calc non Af Amer: 28 mL/min — ABNORMAL LOW (ref >60)
Glucose, Bld: 142 mg/dL — ABNORMAL HIGH (ref 70–99)
Potassium: 3.4 mmol/L — ABNORMAL LOW (ref 3.5–5.1)
Sodium: 130 mmol/L — ABNORMAL LOW (ref 135–145)
Total Bilirubin: 7.2 mg/dL — ABNORMAL HIGH (ref 0.3–1.2)
Total Protein: 5.7 g/dL — ABNORMAL LOW (ref 6.5–8.1)

## 2019-11-17 LAB — IRON AND TIBC
Iron: 48 ug/dL (ref 45–182)
Saturation Ratios: 29 % (ref 17.9–39.5)
TIBC: 168 ug/dL — ABNORMAL LOW (ref 250–450)
UIBC: 120 ug/dL

## 2019-11-17 LAB — FERRITIN: Ferritin: 327 ng/mL (ref 24–336)

## 2019-11-17 MED ORDER — TORSEMIDE 20 MG PO TABS: 20.0000 mg | ORAL_TABLET | Freq: Two times a day (BID) | ORAL | 2 refills | Status: AC

## 2019-11-17 NOTE — Assessment & Plan Note (Signed)
1.  Macrocytic anemia: -Is a combination anemia from liver disease, kidney disease and possible blood loss. -B12, folic acid, methylmalonic acid, ferritin and copper levels were normal. -He denies any bleeding per rectum or melena. -Labs done today on 11/17/2019 showed hemoglobin 7.1 with MCV of 113.3. -Patient is symptomatic feeling very fatigue and sleeps all the time. -Patient refused any blood or iron products.  He refuses any treatment and does not wish to have a follow-up visit with [place] at this time.  He reports he will call us if he needs [place]. -He has been referred to palliative care at this time.  2.  Cirrhosis: -He reported drinking wine daily for a long time. -CT CAP on 10/25/2018 showed cirrhosis, 2 subcentimeter foci of low attenuation identified on the liver too small to characterize.  New complex exophytic lesion in the lower pole of the right kidney.  Nonobstructing right renal calculus.  Small right pleural effusion.  Ascites and abdominal varices. -Total bilirubin varies from 6-8. -GI consultation request was made.  3.  Ascites: -She has gained 20 to 22 pounds in last 1 month. -He was started on Lasix, dose increased to 40 mg with spironolactone 25 mg. -His weight still continued to go up and he was started on torsemide 40 mg daily. -His creatinine has continued to worsen labs done on 11/17/2019 shows creatinine 2.40. -She is slowly developing HRS. -He had his last paracentesis on Monday, 11/13/2019.  He was also given albumin at that time. -He has been encouraged to drink high-protein ensures 3 times a day.  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 -Elevated ferritin and percent saturation is likely from liver disease. -Labs done on 11/17/2019 showed ferritin 327 and percent saturation 29.

## 2019-11-17 NOTE — Progress Notes (Signed)
[facility] [address] [place], [place] [address]   CLINIC:  Medical Oncology/Hematology  PCP:  Assunta Found, MD [address] [place] [address] [phone]   REASON FOR VISIT: Follow-up for cirrhosis and microcytic anemia   INTERVAL HISTORY:  [name] 59 y.o. male returns for routine follow-up for cirrhosis and microcytic anemia.  Patient reports he is feeling very tired and fatigued.  He sleeps the majority of his days.  He refuses any iron or blood products.  He does report some nausea and vomiting.  Denies any new pains. Had not noticed any recent bleeding such as epistaxis, hematuria or hematochezia. Denies recent chest pain on exertion, pre-syncopal episodes, or palpitations. Denies any numbness or tingling in hands or feet. Denies any recent fevers, infections, or recent hospitalizations. Patient reports appetite at 0% and energy level at 0%.     REVIEW OF SYSTEMS:  Review of Systems  Constitutional: Positive for fatigue.  Respiratory: Positive for shortness of breath.   Cardiovascular: Positive for leg swelling.  Gastrointestinal: Positive for nausea and vomiting.  All other systems reviewed and are negative.    PAST MEDICAL/SURGICAL HISTORY:  No past medical history on file. Past Surgical History:  Procedure Laterality Date  . CHOLECYSTECTOMY       SOCIAL HISTORY:  Social History   Socioeconomic History  . Marital status: Married    Spouse name: [name]  . Number of children: 2  . Years of education: Not on file  . Highest education level: Not on file  Occupational History  . Not on file  Tobacco Use  . Smoking status: Never Smoker  . Smokeless tobacco: Never Used  Substance and Sexual Activity  . Alcohol use: Yes    Alcohol/week: 2.0 standard drinks    Types: 2 Glasses of wine per week    Comment: Pt drinks every weekend  . Drug use: Never  . Sexual activity: Not on file  Other Topics Concern  . Not on file  Social  History Narrative  . Not on file   Social Determinants of Health   Financial Resource Strain:   . Difficulty of Paying Living Expenses:   Food Insecurity:   . Worried About Programme researcher, broadcasting/film/video in the Last Year:   . Barista in the Last Year:   Transportation Needs:   . Freight forwarder (Medical):   Marland Kitchen Lack of Transportation (Non-Medical):   Physical Activity:   . Days of Exercise per Week:   . Minutes of Exercise per Session:   Stress:   . Feeling of Stress :   Social Connections:   . Frequency of Communication with Friends and Family:   . Frequency of Social Gatherings with Friends and Family:   . Attends Religious Services:   . Active Member of Clubs or Organizations:   . Attends Banker Meetings:   Marland Kitchen Marital Status:   Intimate Partner Violence:   . Fear of Current or Ex-Partner:   . Emotionally Abused:   Marland Kitchen Physically Abused:   . Sexually Abused:     FAMILY HISTORY:  Family History  Problem Relation Age of Onset  . Heart attack Father     CURRENT MEDICATIONS:  Outpatient Encounter Medications as of 11/17/2019  Medication Sig  . Ascorbic Acid (VITAMIN C) 100 MG tablet Take 500 mg by mouth daily.  . Cinnamon 500 MG capsule Take 500 mg by mouth daily.  . Garlic 100 MG TABS Take  100 mg by mouth daily.  . Misc Natural Products (TART Bartlett ADVANCED PO) Take by mouth daily.  . Multiple Vitamins-Minerals (MENS ONE DAILY PO) Take by mouth daily.  Marland Kitchen spironolactone (ALDACTONE) 25 MG tablet Take 1 tablet (25 mg total) by mouth daily.  Marland Kitchen torsemide (DEMADEX) 20 MG tablet Take 1 tablet (20 mg total) by mouth 2 (two) times daily.  . [DISCONTINUED] torsemide (DEMADEX) 20 MG tablet Take 1 tablet (20 mg total) by mouth daily. (Patient taking differently: Take 20 mg by mouth 2 (two) times daily. )  . naproxen sodium (ALEVE) 220 MG tablet Take 220 mg by mouth daily as needed.   No facility-administered encounter medications on file as of 11/17/2019.     ALLERGIES:  No Known Allergies   PHYSICAL EXAM:  ECOG Performance status: 2  Vitals:   11/17/19 0927  BP: 121/76  Pulse: (!) 103  Resp: 18  Temp: (!) 96.9 F (36.1 C)  SpO2: 100%   Filed Weights   11/17/19 0927  Weight: 200 lb 3.2 oz (90.8 kg)    Physical Exam Constitutional:      Appearance: He is ill-appearing.  Cardiovascular:     Rate and Rhythm: Regular rhythm. Tachycardia present.     Heart sounds: Normal heart sounds.  Pulmonary:     Effort: Pulmonary effort is normal.     Breath sounds: Normal breath sounds.  Abdominal:     General: There is distension.  Musculoskeletal:        General: Swelling present.  Skin:    General: Skin is warm.  Neurological:     Mental Status: He is alert and oriented to person, place, and time. Mental status is at baseline.  Psychiatric:        Mood and Affect: Mood normal.        Behavior: Behavior normal.        Thought Content: Thought content normal.        Judgment: Judgment normal.      LABORATORY DATA:  I have reviewed the labs as listed.  CBC    Component Value Date/Time   WBC 7.9 11/17/2019 0839   RBC 1.96 (L) 11/17/2019 0839   HGB 7.1 (L) 11/17/2019 0839   HCT 22.2 (L) 11/17/2019 0839   PLT 108 (L) 11/17/2019 0839   MCV 113.3 (H) 11/17/2019 0839   MCH 36.2 (H) 11/17/2019 0839   MCHC 32.0 11/17/2019 0839   RDW 18.5 (H) 11/17/2019 0839   LYMPHSABS 1.0 11/17/2019 0839   MONOABS 1.0 11/17/2019 0839   EOSABS 0.3 11/17/2019 0839   BASOSABS 0.1 11/17/2019 0839   CMP Latest Ref Rng & Units 11/17/2019 11/06/2019 10/25/2019  Glucose 70 - 99 mg/dL 142(H) 190(H) 152(H)  BUN 6 - 20 mg/dL 47(H) 30(H) 21(H)  Creatinine 0.61 - 1.24 mg/dL 2.40(H) 1.94(H) 1.53(H)  Sodium 135 - 145 mmol/L 130(L) 129(L) 133(L)  Potassium 3.5 - 5.1 mmol/L 3.4(L) 3.7 3.6  Chloride 98 - 111 mmol/L 96(L) 99 102  CO2 22 - 32 mmol/L 23 21(L) 23  Calcium 8.9 - 10.3 mg/dL 8.1(L) 8.0(L) 8.5(L)  Total Protein 6.5 - 8.1 g/dL 5.7(L) 6.0(L) 6.8   Total Bilirubin 0.3 - 1.2 mg/dL 7.2(H) 6.2(H) 7.0(H)  Alkaline Phos 38 - 126 U/L 146(H) 168(H) 187(H)  AST 15 - 41 U/L 47(H) 55(H) 64(H)  ALT 0 - 44 U/L 24 25 28      I personally performed a face-to-face visit,   All questions were answered to patient's stated satisfaction. Encouraged  patient to call with any new concerns or questions before his next visit to the cancer center and we can certain see him sooner, if needed.     ASSESSMENT & PLAN:   Macrocytic anemia 1.  Macrocytic anemia: -Is a combination anemia from liver disease, kidney disease and possible blood loss. -B12, folic acid, methylmalonic acid, ferritin and copper levels were normal. -He denies any bleeding per rectum or melena. -Labs done today on 11/17/2019 showed hemoglobin 7.1 with MCV of 113.3. -Patient is symptomatic feeling very fatigue and sleeps all the time. -Patient refused any blood or iron products.  He refuses any treatment and does not wish to have a follow-up visit with [place] at this time.  He reports he will call us if he needs [place]. -He has been referred to palliative care at this time.  2.  Cirrhosis: -He reported drinking wine daily for a long time. -CT CAP on 10/25/2018 showed cirrhosis, 2 subcentimeter foci of low attenuation identified on the liver too small to characterize.  New complex exophytic lesion in the lower pole of the right kidney.  Nonobstructing right renal calculus.  Small right pleural effusion.  Ascites and abdominal varices. -Total bilirubin varies from 6-8. -GI consultation request was made.  3.  Ascites: -She has gained 20 to 22 pounds in last 1 month. -He was started on Lasix, dose increased to 40 mg with spironolactone 25 mg. -His weight still continued to go up and he was started on torsemide 40 mg daily. -His creatinine has continued to worsen labs done on 11/17/2019 shows creatinine 2.40. -She is slowly developing HRS. -He had his last paracentesis on Monday, 11/13/2019.  He was also  given albumin at that time. -He has been encouraged to drink high-protein ensures 3 times a day.  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 -Elevated ferritin and percent saturation is likely from liver disease. -Labs done on 11/17/2019 showed ferritin 327 and percent saturation 29.       Orders placed this encounter:  Orders Placed This Encounter  Procedures  . Ferritin  . Iron and TIBC      Mathis Bud, FNP-C [facility] [phone]

## 2019-11-20 ENCOUNTER — Telehealth: Payer: Self-pay

## 2019-11-20 ENCOUNTER — Encounter (HOSPITAL_COMMUNITY): Payer: Self-pay | Admitting: Lab

## 2019-11-20 NOTE — Telephone Encounter (Signed)
Spoke with patient's wife, Mora Bellman. Introduced Palliative care and offered to schedule visit with NP. Verbal consent obtained. Visit scheduled for 11/23/19 @ 1pm

## 2019-11-20 NOTE — Progress Notes (Unsigned)
Referral sent to [facility] Palliative.  Records faxed on 4/12

## 2019-11-22 NOTE — Progress Notes (Addendum)
April 15th, 2020 [facility] Palliative Care Consult Note Telephone: [phone]  Fax: [phone]  Addendum: Per Dr. Delton Coombes, ok to prescribe Rifaximin 550 bid, and Lactulose 30G tid-bid (titrate for 3-4 bowel movements/d. Scripts e-prescribed to [facility] on [address]. I also sent in script for Torsemide 20mg  bid (running out early d/t dosage increase).   PATIENT NAME: [name] DOB: 11-Jul-1961 MRN: [id number]  [address] [place], [address] *[phone] [facility] [address] PROVIDER:  Sharilyn Sites, [place],  [place] [address]  REFERRING PROVIDER: Dr. Derek Jack (Oncology; [place])  RESPONSIBLE PARTY: (spouse) [name]. Daughter [name] [phone]  ASSESSMENT / RECOMMENDATIONS:   1. Advance Care Planning: A. Directives: In the presence of his daughter [name] and spouse [name], we discussed use of CPR in the event of a cardiopulmonary arrest. Patient states tearfully that he would desire no aggressive intervention. But due to the emotion he exhibited with this statement, I'm not sure that he is taking this position so as not to be a financial and care burden to his family. I asked him if he wished more time to think about this decision, and he said yes. Though somnolent and slow of speech at this time, he appears competent to make his decisions.   B. Goals of Care: Patient states, tearfully, that he doesn't want to be "fussed with". His spouse of 4 years, and his daughter from his first marriage, confirm that this is consistent with patient's long-standing personality trait. We did discuss that, if his spouse found him unresponsive, that she plans to call 911 for treatment and care. He said he understood. He is refusing to see GI consultant, but amenable to continue seeing Dr. Delton Coombes. His spouse will be stopping by Dr. Marthann Schiller office this afternoon  for completion of paperwork; plans to make a f/u appointment. We discussed his low of Hgb 7.1 (11/17/19). He was refusing possible future blood transfusions, but would be accepting if his daughter [name] were to be his doner (she is in on the ZOOM call and she brought this up as a possibility)  Patient, daughter [name], and spouse Alvin and I had a frank discussion regarding prognosis. I discussed that patient would be a hospice candidate if he decides to forgo all treatment. But he may have an opportunity to feel better, think more clearly, and be present to enjoy more time if he considers/follows recommended treatment plan by his practitioners. He is amenable to seeing Dr. Raliegh Ip, but not GI (at this time). Would be OK with future paracentesis on a prn basis.  I reviewed in brief the symptoms arising from his liver cirrhosis (ascites, bleeding, edema, low albumin, encephalopathy d/t elevated ammonia levels), and associated kidney compromise (watching the creatinine), and possible treatments that may be recommended by his team. We discussed possible initiation of Lactulose and Rifaximin to decrease serum ammonia to help with symptoms of HE , and patient was okay with that plan.  3. Cognitive / Functional status: Progressive decline in cognition: He is somnolent, awake only about 3-4 hours/day. He is easily arousable to voice  but will drop off to sleep within a few minutes if not directly engaged. When I assisted him with obtaining his weight, he needed cueing to take steps.Three weeks earlier patient was driving and ambulating/transferring without assistive devices. Currently one-two person heavy assist to stand, though can stand independently if from a higher seat. He needs  assistance with ADLs. His weight today is 204 pounds, stable from office visit 11/03/19 when he was 206lbs. This is a gain of almost 30 lbs over the last 2-3 months. Has been on Torsemide 20mg  bid, Aldactone 25mg  qd, and had a paracentesis of 4  liters (11/13/19). His abdomen is tight, and he has pitting edema to high thigh, as well as edema bilateral UEs R>L mildly pitting to elbows. Spouse reports less scrotal edema since paracentesis. He denies melana or hemoptysis. His lips are chapped and seemed to have had some bleeding. He is mildly jaundiced, with mild icteric sclera. Oral intake decreased to 25% of one meal a day; fluid intake about 1 L. Spouse has been trying to get him to drink pedilite; refusing ensure as it makes him nauseous. Last emesis about 5 days ago. He is drinking less than a  glass of wine a day, if that. I did emphasize that he should drink NO wine at all. A few small sores on LEs, non-bleeding.   -I'll check with Dr. ; check his thoughts about starting Lactulose 27ml/30g po qid, and rifaximin 550 mg bid for symptoms of hepatic encephalopathy. I'll call in script for renewal of Torsemide 20mg  bid (they are running low as dose was recently increased  4. Family Supports: Lives in his own home with spouse of 4 years Alvin. They each have 2 grown children from previous marriages. His daughter [name] is on ZOOM call today. She plans to fly in from [place] for a week, to assist with patient's care.   5. Follow up Palliative Care Visit: Thurs 11/30/19 at noon [place] visiting (step daughter)  I spent 60 minutes providing this consultation  from 1pm to 2pm. More than 50% of the time in this consultation was spent coordinating communication.   HISTORY OF PRESENT ILLNESS:  [name] is a 59 y.o. year old male with cirrhosis of the liver (with ascites), macrocytic anemia .   Palliative Care was asked to help with goals of care conversations, and with ACP.   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 No pertinent past medical history.  SOCIAL HX:  Social History   Tobacco Use  . Smoking status: Never Smoker  . Smokeless tobacco: Never Used  Substance Use Topics  . Alcohol use: Yes    Alcohol/week: 2.0 standard drinks    Types: 2 Glasses of wine per week    Comment: Pt drinks every weekend    ALLERGIES: No Known Allergies   PERTINENT MEDICATIONS:  Outpatient Encounter Medications as of 11/23/2019  Medication Sig  . lactulose, encephalopathy, (CHRONULAC) 10 GM/15ML SOLN Take 30 g by mouth in the morning, at noon, in the evening, and at bedtime. More or less, so as to have 3-4 bowel movements/day  . rifaximin (XIFAXAN) 550 MG TABS tablet Take 550 mg by mouth 2 (two) times daily.  67 spironolactone (ALDACTONE) 25 MG tablet Take 1 tablet (25 mg total) by mouth daily.  10/27/19 torsemide (DEMADEX) 20 MG tablet Take 1 tablet (20 mg total) by mouth 2 (two) times daily.  . Ascorbic Acid (VITAMIN C) 100 MG tablet Take 500 mg by mouth daily.  . Cinnamon 500 MG capsule Take 500 mg by mouth daily.  . Garlic 100  MG TABS Take 100 mg by mouth daily.  . Misc Natural Products (TART CHERRY ADVANCED PO) Take by mouth daily.  . Multiple Vitamins-Minerals (MENS ONE DAILY PO) Take by mouth daily.  . naproxen sodium (ALEVE) 220 MG tablet Take 220 mg by mouth daily as needed.   No facility-administered encounter medications on file as of 11/23/2019.    PHYSICAL EXAM:   Lethargic, somnolent; sitting up dozing in couch. Arouses easily to voice. Pleasantly conversant and affable. Oriented. Spouse Alvin and daughter [name] (on ZOOM) in attendance. Step daughter [name] and her baby were present initially.  BP 110/80, HR 78, RR 22, Sat RA 96% Cardiac: RRR Lungs: CTA Abs: tight, protruding Extremities: Pitting edema to high thigh. Edema in bilateral UEs R.L Left hand swollen. Mildly pitting to elbow Extremities: no edema, no joint  deformities Skin: He is mildly jaundiced, with mild icteric sclera. Neurological: Weakness but otherwise non-focal  Anselm Lis, NP

## 2019-11-23 ENCOUNTER — Other Ambulatory Visit: Payer: BC Managed Care – PPO | Admitting: Internal Medicine

## 2019-11-23 ENCOUNTER — Encounter: Payer: Self-pay | Admitting: Internal Medicine

## 2019-11-23 ENCOUNTER — Other Ambulatory Visit: Payer: Self-pay

## 2019-11-23 ENCOUNTER — Other Ambulatory Visit (HOSPITAL_COMMUNITY): Payer: Self-pay | Admitting: Nurse Practitioner

## 2019-11-23 DIAGNOSIS — Z515 Encounter for palliative care: Secondary | ICD-10-CM | POA: Diagnosis not present

## 2019-11-23 DIAGNOSIS — Z7189 Other specified counseling: Secondary | ICD-10-CM | POA: Diagnosis not present

## 2019-11-25 ENCOUNTER — Encounter (HOSPITAL_COMMUNITY): Payer: Self-pay | Admitting: Emergency Medicine

## 2019-11-25 ENCOUNTER — Emergency Department (HOSPITAL_COMMUNITY)
Admission: EM | Admit: 2019-11-25 | Discharge: 2019-11-25 | Disposition: A | Discharge status: Home / Self Care | Payer: BC Managed Care – PPO | Attending: Emergency Medicine | Admitting: Emergency Medicine

## 2019-11-25 ENCOUNTER — Emergency Department (HOSPITAL_COMMUNITY): Payer: BC Managed Care – PPO

## 2019-11-25 ENCOUNTER — Other Ambulatory Visit: Payer: Self-pay

## 2019-11-25 DIAGNOSIS — D649 Anemia, unspecified: Secondary | ICD-10-CM | POA: Diagnosis not present

## 2019-11-25 DIAGNOSIS — R918 Other nonspecific abnormal finding of lung field: Secondary | ICD-10-CM | POA: Diagnosis not present

## 2019-11-25 DIAGNOSIS — K7290 Hepatic failure, unspecified without coma: Secondary | ICD-10-CM | POA: Insufficient documentation

## 2019-11-25 DIAGNOSIS — R5383 Other fatigue: Secondary | ICD-10-CM | POA: Diagnosis not present

## 2019-11-25 DIAGNOSIS — R531 Weakness: Secondary | ICD-10-CM | POA: Diagnosis not present

## 2019-11-25 DIAGNOSIS — K7031 Alcoholic cirrhosis of liver with ascites: Secondary | ICD-10-CM

## 2019-11-25 DIAGNOSIS — K7682 Hepatic encephalopathy: Secondary | ICD-10-CM

## 2019-11-25 DIAGNOSIS — R5381 Other malaise: Secondary | ICD-10-CM | POA: Diagnosis not present

## 2019-11-25 HISTORY — DX: Unspecified cirrhosis of liver: K74.60

## 2019-11-25 LAB — COMPREHENSIVE METABOLIC PANEL
ALT: 20 U/L (ref 0–44)
AST: 38 U/L (ref 15–41)
Albumin: 1.4 g/dL — ABNORMAL LOW (ref 3.5–5.0)
Alkaline Phosphatase: 114 U/L (ref 38–126)
Anion gap: 10 (ref 5–15)
BUN: 65 mg/dL — ABNORMAL HIGH (ref 6–20)
CO2: 23 mmol/L (ref 22–32)
Calcium: 8.2 mg/dL — ABNORMAL LOW (ref 8.9–10.3)
Chloride: 91 mmol/L — ABNORMAL LOW (ref 98–111)
Creatinine, Ser: 2.25 mg/dL — ABNORMAL HIGH (ref 0.61–1.24)
GFR calc Af Amer: 35 mL/min — ABNORMAL LOW (ref >60)
GFR calc non Af Amer: 31 mL/min — ABNORMAL LOW (ref >60)
Glucose, Bld: 128 mg/dL — ABNORMAL HIGH (ref 70–99)
Potassium: 4.1 mmol/L (ref 3.5–5.1)
Sodium: 124 mmol/L — ABNORMAL LOW (ref 135–145)
Total Bilirubin: 6.6 mg/dL — ABNORMAL HIGH (ref 0.3–1.2)
Total Protein: 5.4 g/dL — ABNORMAL LOW (ref 6.5–8.1)

## 2019-11-25 LAB — CBC WITH DIFFERENTIAL/PLATELET
Abs Immature Granulocytes: 0.06 10*3/uL (ref 0.00–0.07)
Basophils Absolute: 0 10*3/uL (ref 0.0–0.1)
Basophils Relative: 0 %
Eosinophils Absolute: 0.3 10*3/uL (ref 0.0–0.5)
Eosinophils Relative: 2 %
HCT: 20.5 % — ABNORMAL LOW (ref 39.0–52.0)
Hemoglobin: 6.8 g/dL — CL (ref 13.0–17.0)
Immature Granulocytes: 1 %
Lymphocytes Relative: 6 %
Lymphs Abs: 0.6 10*3/uL — ABNORMAL LOW (ref 0.7–4.0)
MCH: 36 pg — ABNORMAL HIGH (ref 26.0–34.0)
MCHC: 33.2 g/dL (ref 30.0–36.0)
MCV: 108.5 fL — ABNORMAL HIGH (ref 80.0–100.0)
Monocytes Absolute: 1.2 10*3/uL — ABNORMAL HIGH (ref 0.1–1.0)
Monocytes Relative: 11 %
Neutro Abs: 8.4 10*3/uL — ABNORMAL HIGH (ref 1.7–7.7)
Neutrophils Relative %: 80 %
Platelets: 111 10*3/uL — ABNORMAL LOW (ref 150–400)
RBC: 1.89 MIL/uL — ABNORMAL LOW (ref 4.22–5.81)
RDW: 16.8 % — ABNORMAL HIGH (ref 11.5–15.5)
WBC: 10.5 10*3/uL (ref 4.0–10.5)
nRBC: 0.2 % (ref 0.0–0.2)

## 2019-11-25 LAB — TYPE AND SCREEN
ABO/RH(D): O POS
Antibody Screen: NEGATIVE

## 2019-11-25 LAB — LIPASE, BLOOD: Lipase: 99 U/L — ABNORMAL HIGH (ref 11–51)

## 2019-11-25 LAB — PROTIME-INR
INR: 3.2 — ABNORMAL HIGH (ref 0.8–1.2)
Prothrombin Time: 32.6 seconds — ABNORMAL HIGH (ref 11.4–15.2)

## 2019-11-25 LAB — AMMONIA: Ammonia: 104 umol/L — ABNORMAL HIGH (ref 9–35)

## 2019-11-25 MED ORDER — LACTULOSE 10 GM/15ML PO SOLN
20.0000 g | Freq: Once | ORAL | Status: AC
  Administered 2019-11-25: 20 g via ORAL
  Filled 2019-11-25: qty 30

## 2019-11-25 NOTE — ED Notes (Signed)
Call to lab: POC [name]  Query regarding CBC result [name] insists that it has been cancelled She is apprised that the duplicate order was cancelled but a CBC order is in place  She insist that to run the CBC another order has to be entered  Another order entered with now two CBC orders in orders

## 2019-11-25 NOTE — Progress Notes (Signed)
Arterial lab draw completed

## 2019-11-25 NOTE — ED Notes (Signed)
Pt stick for labs x 2   Unable to gain access  Resp down to draw arterial   resp draw x 2   Blood to lab   Spouse at bedside

## 2019-11-25 NOTE — ED Notes (Signed)
Call to EMS for pt transport home

## 2019-11-25 NOTE — ED Provider Notes (Signed)
Emergency Department Provider Note   I have reviewed the triage vital signs and the nursing notes.   HISTORY  Chief Complaint Fatigue   HPI [name] is a 59 y.o. male with PMH of alcoholic liver cirrhosis and ascites presents to the emergency department with worsening generalized weakness.  Patient is followed as an outpatient by Dr. Ellin Saba and his PCP.  Patient is also followed by palliative care who last saw him 2 days ago. He obtains therapeutic paracentesis with his last procedure on 4/5. Hospice was offered but patient is considering at this time.  He reports worsening generalized weakness.  He has recently started rifaximin and lactulose and continues to take torsemide at home.  His hemoglobins were recently found to be low and near the point of requiring blood transfusion.  Patient has not noticed any bright red blood or melena at home.  He denies fevers.  He is not experiencing chest pain or shortness of breath.  He feels like his weakness is getting worse and so presents to the emergency department for evaluation. Per note from palliate care the patient will not seek GI evaluation and is only willing to accept blood from a close relative.    Past Medical History:  Diagnosis Date  . Cirrhosis of liver [facility])     Patient Active Problem List   Diagnosis Date Noted  . Ascites due to alcoholic cirrhosis (HCC) 11/06/2019  . Macrocytic anemia 10/09/2019  . Cirrhosis of liver (HCC) 10/09/2019    Past Surgical History:  Procedure Laterality Date  . CHOLECYSTECTOMY      Allergies Patient has no known allergies.  Family History  Problem Relation Age of Onset  . Heart attack Father     Social History Social History   Tobacco Use  . Smoking status: Never Smoker  . Smokeless tobacco: Never Used  Substance Use Topics  . Alcohol use: Yes    Alcohol/week: 2.0 standard drinks    Types: 2 Glasses of wine per week    Comment: Pt drinks every weekend  . Drug use:  Never    Review of Systems  Constitutional: No fever/chills. Positive generalized weakness.  Eyes: No visual changes. ENT: No sore throat. Cardiovascular: Denies chest pain. Respiratory: Denies shortness of breath. Gastrointestinal: No abdominal pain.  No nausea, no vomiting.  No diarrhea.  No constipation. Positive abdominal distension.  Genitourinary: Negative for dysuria. Musculoskeletal: Negative for back pain. Skin: Negative for rash. Neurological: Negative for headaches, focal weakness or numbness.  10-point ROS otherwise negative.  ____________________________________________   PHYSICAL EXAM:  VITAL SIGNS: ED Triage Vitals  Enc Vitals Group     BP 11/25/19 1044 117/68     Pulse Rate 11/25/19 1044 (!) 109     Resp 11/25/19 1044 17     Temp 11/25/19 1044 98.1 F (36.7 C)     Temp Source 11/25/19 1044 Oral     SpO2 11/25/19 1044 97 %   Constitutional: Drowsy but awakens to voice and has a normal conversation with me without frequent prompting or needing to arouse.  Eyes: Conjunctivae are normal. Icteric sclera.  Head: Atraumatic. Nose: No congestion/rhinnorhea. Mouth/Throat: Mucous membranes are slightly dry.  Neck: No stridor.   Cardiovascular: Tachycardia. Good peripheral circulation. Grossly normal heart sounds.   Respiratory: Normal respiratory effort.  No retractions. Lungs CTAB. Gastrointestinal: Soft and nontender. Positive distention with clinical ascites.  Musculoskeletal: No lower extremity tenderness and 3+ pitting edema in the B/L LE. No gross deformities of  extremities. Neurologic:  Normal speech and language. Skin:  Skin is warm, dry and intact. No rash noted. Jaundice noted.   ____________________________________________   LABS (all labs ordered are listed, but only abnormal results are displayed)  Labs Reviewed  COMPREHENSIVE METABOLIC PANEL - Abnormal; Notable for the following components:      Result Value   Sodium 124 (*)    Chloride 91  (*)    Glucose, Bld 128 (*)    BUN 65 (*)    Creatinine, Ser 2.25 (*)    Calcium 8.2 (*)    Total Protein 5.4 (*)    Albumin 1.4 (*)    Total Bilirubin 6.6 (*)    GFR calc non Af Amer 31 (*)    GFR calc Af Amer 35 (*)    All other components within normal limits  LIPASE, BLOOD - Abnormal; Notable for the following components:   Lipase 99 (*)    All other components within normal limits  PROTIME-INR - Abnormal; Notable for the following components:   Prothrombin Time 32.6 (*)    INR 3.2 (*)    All other components within normal limits  AMMONIA - Abnormal; Notable for the following components:   Ammonia 104 (*)    All other components within normal limits  CBC WITH DIFFERENTIAL/PLATELET - Abnormal; Notable for the following components:   RBC 1.89 (*)    Hemoglobin 6.8 (*)    HCT 20.5 (*)    MCV 108.5 (*)    MCH 36.0 (*)    RDW 16.8 (*)    Platelets 111 (*)    Neutro Abs 8.4 (*)    Lymphs Abs 0.6 (*)    Monocytes Absolute 1.2 (*)    All other components within normal limits  URINE CULTURE  CBC WITH DIFFERENTIAL/PLATELET  URINALYSIS, ROUTINE W REFLEX MICROSCOPIC  TYPE AND SCREEN   ____________________________________________  EKG  None  ____________________________________________  RADIOLOGY  DG Chest Portable 1 View  Result Date: 11/25/2019 CLINICAL DATA:  Edema. EXAM: PORTABLE CHEST 1 VIEW COMPARISON:  April 14, 2011. FINDINGS: The heart size and mediastinal contours are within normal limits. No pneumothorax is noted. Increased bilateral midlung and basilar opacities are noted concerning for pneumonia or possibly edema. Small pleural effusions may be present. The visualized skeletal structures are unremarkable. IMPRESSION: Increased bilateral midlung and basilar opacities are noted concerning for pneumonia or possibly edema. Small pleural effusions may be present. Electronically Signed   By: Lupita Raider M.D.   On: 11/25/2019 11:55     ____________________________________________   PROCEDURES  Procedure(s) performed:   Procedures  None  ____________________________________________   INITIAL IMPRESSION / ASSESSMENT AND PLAN / ED COURSE  Pertinent labs & imaging results that were available during my care of the patient were reviewed by me and considered in my medical decision making (see chart for details).   Patient presents emergency department with generalized weakness in the setting of alcoholic liver cirrhosis.  Patient assessed by palliative care 2 days ago and offered hospice but patient is still considering.  He was started on lactulose and rifaximin at that time and continues with torsemide.  He has mild tachycardia here but otherwise normal vital signs.  His abdomen is diffusely soft and nontender and I do not suspect SBP.  Lungs are clear.  He does have pitting edema in the legs as noted above.  Plan for screening lab work including ammonia, CBC, chemistry, along with chest x-ray.  The patient's wife is in  route to the hospital and will continue discussion with her at bedside.   02:30 PM  Labs resulted and patient's wife at bedside for discussion. Patient's ammonia is elevated to gre and correlates withater than 100.  Lactulose was given here and patient tolerated well PO.  The patient's wife was able to call to a local pharmacy and get the prescription filled for today.  We discussed to the patient's goals of care both with him and with the wife.  She states that he has been consistent about issues regarding blood transfusion, GI consultation, and hospitalization.  He is adamantly opposed to these interventions.  We discussed that his hemoglobin is down trended to 6.8 and that he would likely feel some improvement in symptoms with a blood transfusion here.  I offered to provide a blood transfusion and facilitate transport home but patient continues to refuse which is consistent with his prior wishes.  The  patient's wife notes that she is getting some pressure from his children about going against many of his wishes and forcing certain treatments.  In my conversation with the patient he is drowsy but awakens easily and engages in conversation.  He remains adamant about not receiving blood transfusion or staying in the hospital although this was encouraged.   Patient's wife tells me that they have had [place] discussions about this and that the patient's primary goal is to spend as much time as he can at home with family.  Will facilitate this.  Patient's wife understands that she can return anytime and we will continue to follow with Dr. Raliegh Ip and their PCP. Patient in agreement with plan.    FINAL CLINICAL IMPRESSION(S) / ED DIAGNOSES  Final diagnoses:  Hepatic encephalopathy ([place])  Symptomatic anemia  Alcoholic cirrhosis of liver with ascites (HCC)    MEDICATIONS GIVEN DURING THIS VISIT:  Medications  lactulose (CHRONULAC) 10 GM/15ML solution 20 g (20 g Oral Given 11/25/19 1230)    Note:  This document was prepared using Dragon voice recognition software and may include unintentional dictation errors.  Nanda Quinton, MD, [facility] Emergency Medicine    Beryl Hornberger, [facility], MD 11/25/19 [phone]

## 2019-11-25 NOTE — ED Notes (Signed)
Pt with end stage liver disease  Continues to drink   Here to be evaluated for confusion, weakness

## 2019-11-25 NOTE — ED Notes (Signed)
Dr Jacqulyn Bath in to speak with pt and spouse

## 2019-11-25 NOTE — ED Triage Notes (Signed)
Patient brought in via EMS from home. Alert and oriented to person and place. Patient c/o generalized weakness all over. Denies any nausea, vomiting, or diarrhea. Per paramedic patient sent by hospice nurse requesting evaluation to place patient on Lactulose. Patient has cirrhosis of the liver and is jaundice. Patient does have some confusion to time and situation.

## 2019-11-25 NOTE — Discharge Instructions (Signed)
You were seen in the emergency department today with increased sleepiness.  Your ammonia levels are elevated causing this and should improve somewhat with lactulose.  Please get this filled at the pharmacy and take as directed.  We offered a blood transfusion here to try and improve symptoms along with admission to the hospital.  You are choosing to go home but I feel you would do well to contact your palliative care team and make them aware that you are ready for hospice type care.  Please return to the emergency department any time should you change your mind about the blood transfusion or develop any new or suddenly worsening symptoms.

## 2019-11-25 NOTE — ED Notes (Signed)
Pt. Knows that we are needing urine. Unable to go at this time.

## 2019-11-26 ENCOUNTER — Telehealth: Payer: Self-pay | Admitting: Internal Medicine

## 2019-11-26 NOTE — Telephone Encounter (Signed)
11am.  Telephone call from daughter Gerilyn Nestle and spouse Ruby. Patient increasingly somnolent. He was able to take just a few bites of scrambled eggs this morning and sips of fluid. He can take his Lactulose and has been having  bowel movement response. It is increasingly difficult for him to take his pills.  Family is requesting Hospice services. I consulted Dr. Antonieta Loveless [facility] physician) who felt patient met eligibility criteria. Our referral team was consulted. They have an opening for admitting visit nurse to come to patient's home tomorrow 11/27/19. They will call Ruby and update.   Violeta Gelinas NP - C [phone]

## 2019-12-01 ENCOUNTER — Other Ambulatory Visit (HOSPITAL_COMMUNITY): Payer: BC Managed Care – PPO

## 2019-12-01 ENCOUNTER — Ambulatory Visit (HOSPITAL_COMMUNITY): Payer: BC Managed Care – PPO | Admitting: Nurse Practitioner

## 2019-12-09 DEATH — deceased

## 2020-02-14 ENCOUNTER — Ambulatory Visit (INDEPENDENT_AMBULATORY_CARE_PROVIDER_SITE_OTHER): Payer: BC Managed Care – PPO | Admitting: Gastroenterology

## 2020-12-24 IMAGING — US US ABDOMEN COMPLETE
1 series · 14 of 25 positions shown · non-contrast
Comparison: CT 10/29/2014.

CLINICAL DATA: Abnormal LFTs.  Cholecystectomy.

EXAM:
ABDOMEN ULTRASOUND COMPLETE

[Series 1: us abdomen complete · 84 acquisitions, 14 frames shown]
[im 1/84]
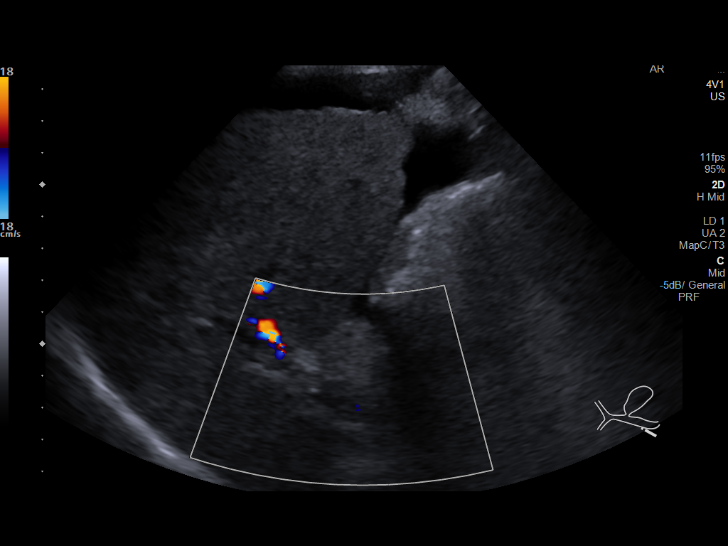
[im 7/84]
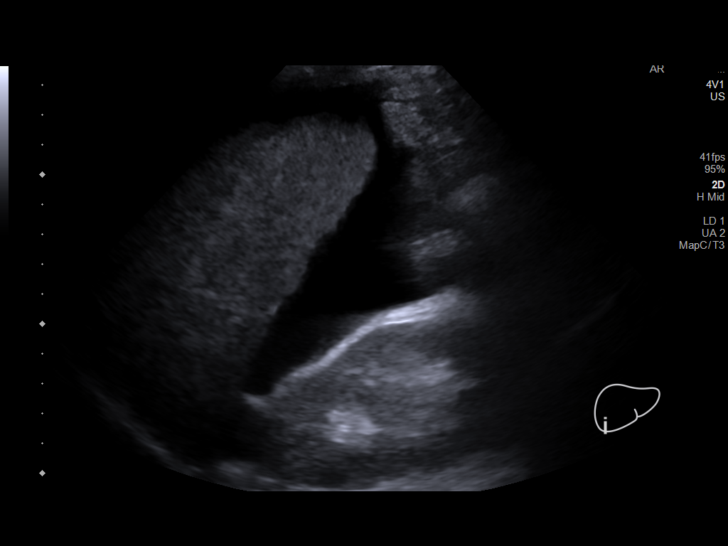
[im 14/84]
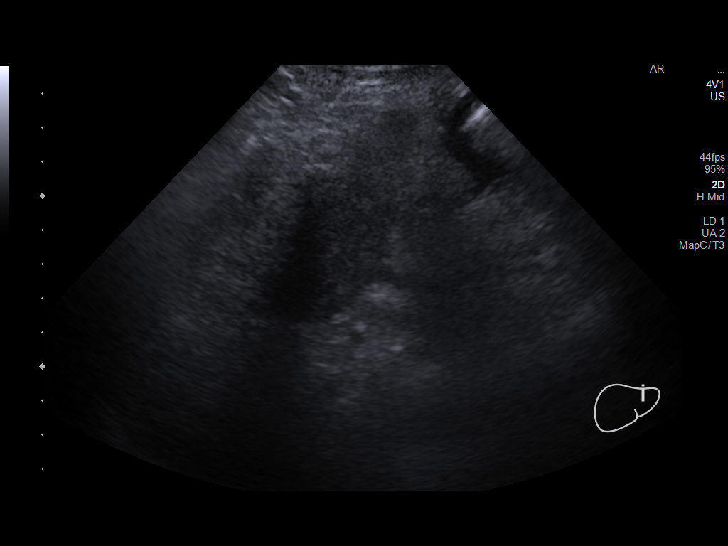
[im 21/84]
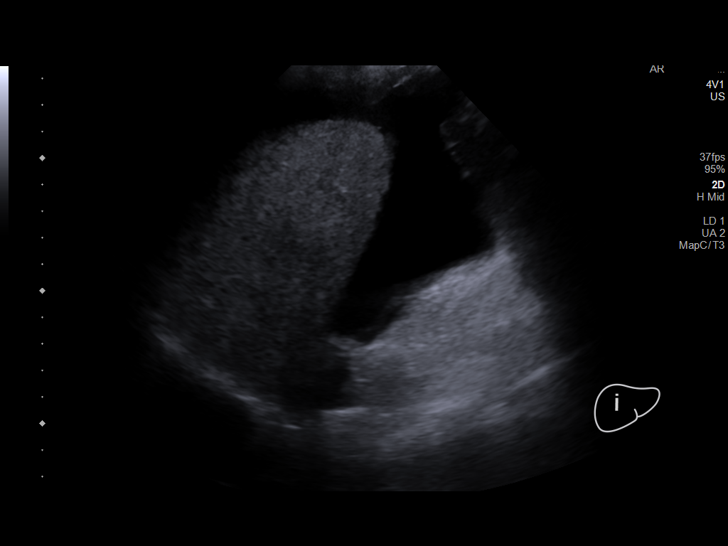
[im 28/84]
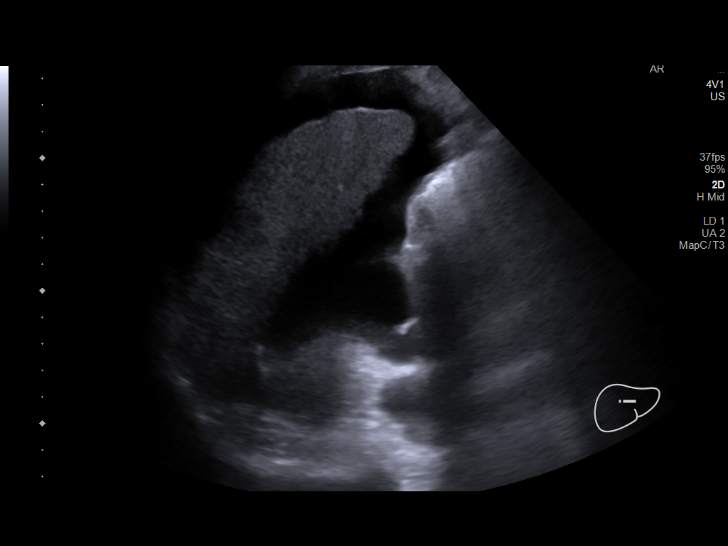
[im 32/84]
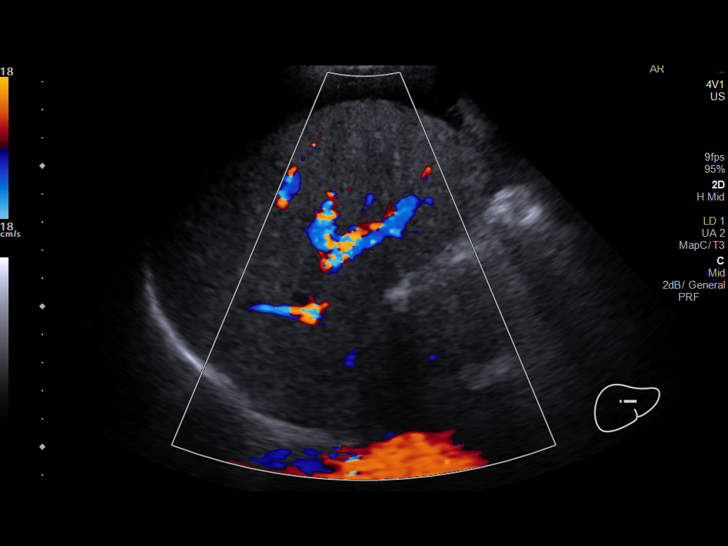
[im 39/84]
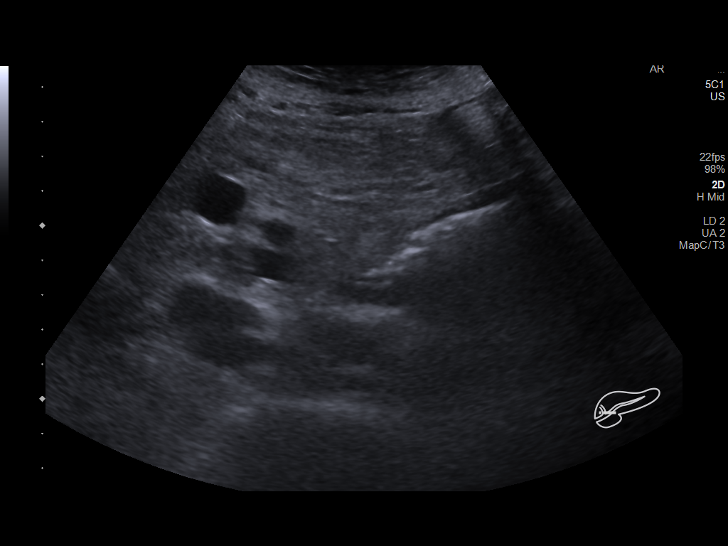
[im 45/84]
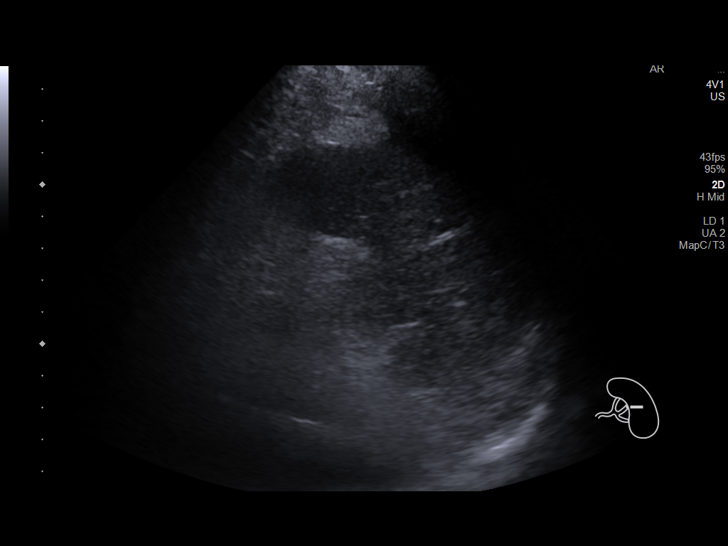
[im 52/84]
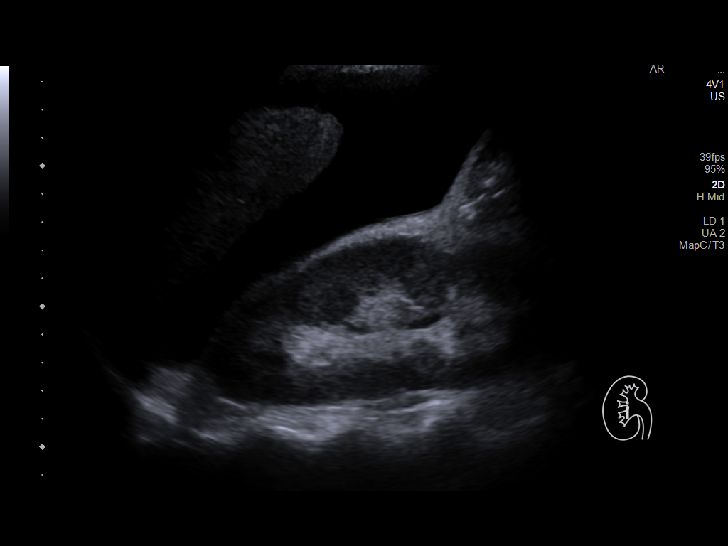
[im 56/84]
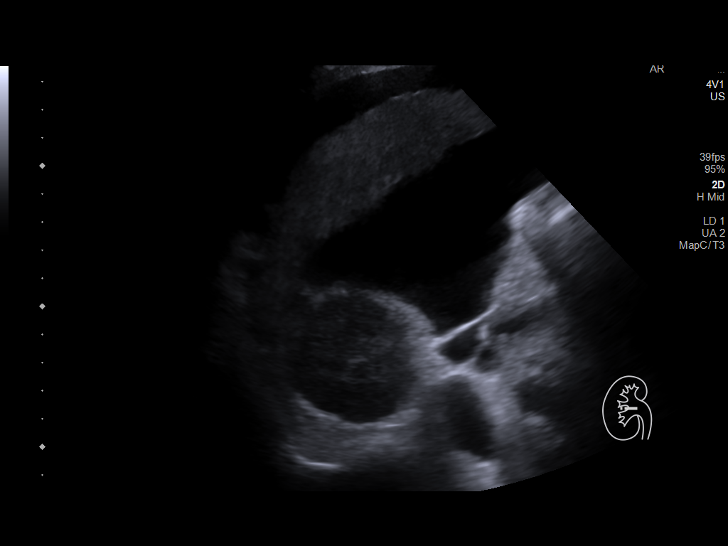
[im 63/84]
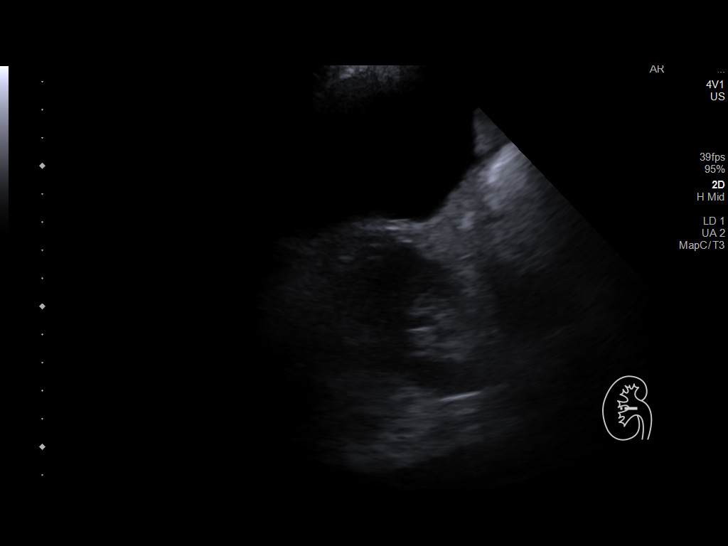
[im 70/84]
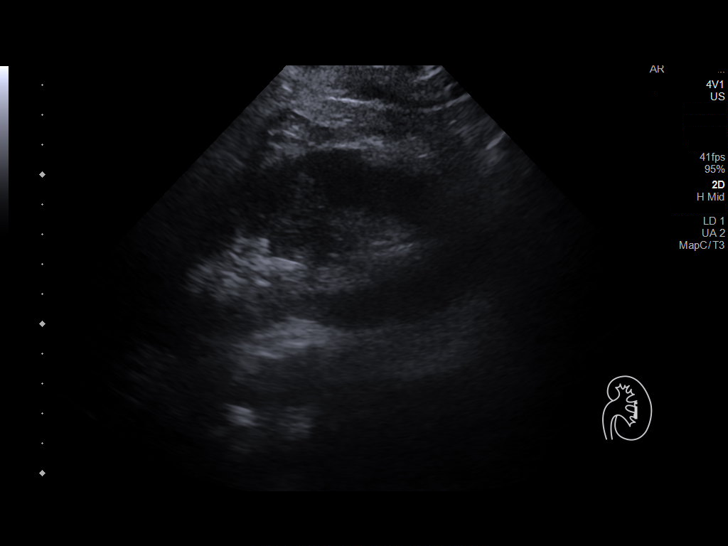
[im 77/84]
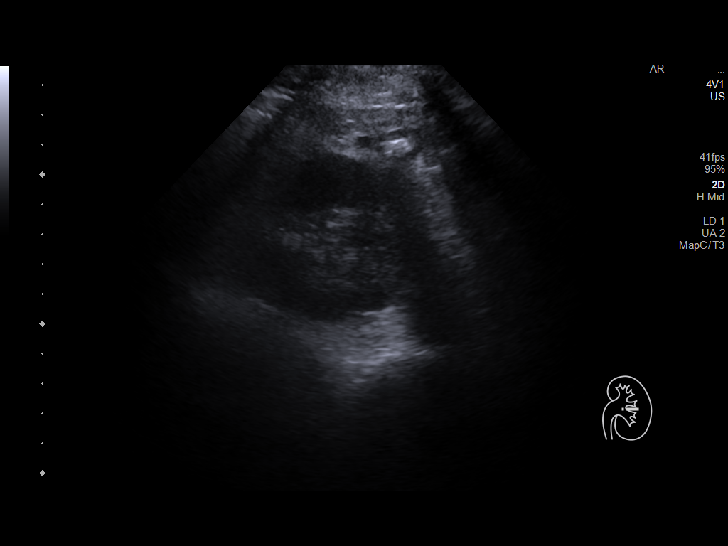
[im 84/84]
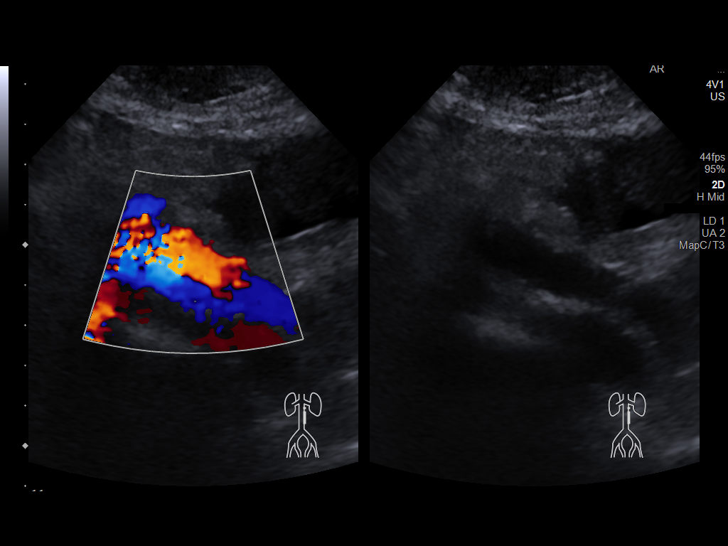

[14 of 25 positions shown; findings below may reference images not displayed]

FINDINGS: Gallbladder: Cholecystectomy.

Common bile duct: Diameter: 3 mm

Liver: Heterogeneous lobular hepatic parenchymal pattern noted. This
suggest cirrhosis. Reversal of portal venous flow noted.

IVC: No abnormality visualized.

Pancreas: Limited visualization due to overlying bowel gas.

Spleen: Size and appearance within normal limits.

Right Kidney: Length: 12.8 cm. Echogenicity within normal limits. No
mass or hydronephrosis visualized.

Left Kidney: Length: 13.8 cm. Echogenicity within normal limits. No
mass or hydronephrosis visualized.

Abdominal aorta: No aneurysm visualized.

Other findings: Moderate ascites.
IMPRESSION: 1.  Cholecystectomy.  No biliary distention.

2. Heterogeneous lobular hepatic parenchymal pattern. This suggest
cirrhosis. No focal hepatic abnormality identified. Reversal of
portal venous flow noted.

3.  Moderate ascites.
